# Patient Record
Sex: Female | Born: 1980
Health system: Southern US, Community
[De-identification: ages and names within clinical notes are randomized; demographics above are authoritative.]

## PROBLEM LIST (undated history)

## (undated) ENCOUNTER — Inpatient Hospital Stay (HOSPITAL_COMMUNITY): Payer: Self-pay

## (undated) DIAGNOSIS — F32A Depression, unspecified: Secondary | ICD-10-CM

## (undated) DIAGNOSIS — D649 Anemia, unspecified: Secondary | ICD-10-CM

## (undated) DIAGNOSIS — G4700 Insomnia, unspecified: Secondary | ICD-10-CM

## (undated) DIAGNOSIS — N390 Urinary tract infection, site not specified: Secondary | ICD-10-CM

## (undated) DIAGNOSIS — F329 Major depressive disorder, single episode, unspecified: Secondary | ICD-10-CM

## (undated) DIAGNOSIS — Z9884 Bariatric surgery status: Secondary | ICD-10-CM

## (undated) DIAGNOSIS — F1910 Other psychoactive substance abuse, uncomplicated: Secondary | ICD-10-CM

## (undated) DIAGNOSIS — N83209 Unspecified ovarian cyst, unspecified side: Secondary | ICD-10-CM

## (undated) HISTORY — DX: Bariatric surgery status: Z98.84

## (undated) HISTORY — DX: Other psychoactive substance abuse, uncomplicated: F19.10

## (undated) HISTORY — DX: Morbid (severe) obesity due to excess calories: E66.01

## (undated) HISTORY — DX: Anemia, unspecified: D64.9

## (undated) HISTORY — PX: BREAST REDUCTION SURGERY: SHX8

## (undated) HISTORY — PX: INDUCED ABORTION: SHX677

---

## 2002-08-09 HISTORY — PX: CHOLECYSTECTOMY: SHX55

## 2002-08-10 DIAGNOSIS — Z9884 Bariatric surgery status: Secondary | ICD-10-CM

## 2002-08-10 HISTORY — DX: Bariatric surgery status: Z98.84

## 2003-01-23 HISTORY — PX: GASTRIC BYPASS: SHX52

## 2004-03-20 ENCOUNTER — Emergency Department (HOSPITAL_COMMUNITY): Admission: EM | Admit: 2004-03-20 | Discharge: 2004-03-20 | Payer: Self-pay | Admitting: Emergency Medicine

## 2004-04-09 ENCOUNTER — Ambulatory Visit (HOSPITAL_COMMUNITY): Admission: RE | Admit: 2004-04-09 | Discharge: 2004-04-09 | Payer: Self-pay | Admitting: Obstetrics and Gynecology

## 2004-04-23 ENCOUNTER — Ambulatory Visit: Payer: Self-pay | Admitting: Family Medicine

## 2004-04-29 ENCOUNTER — Inpatient Hospital Stay (HOSPITAL_COMMUNITY): Admission: AD | Admit: 2004-04-29 | Discharge: 2004-04-29 | Payer: Self-pay | Admitting: *Deleted

## 2004-04-29 ENCOUNTER — Ambulatory Visit: Payer: Self-pay | Admitting: Family Medicine

## 2004-05-28 ENCOUNTER — Ambulatory Visit: Payer: Self-pay | Admitting: Obstetrics & Gynecology

## 2004-05-29 ENCOUNTER — Ambulatory Visit (HOSPITAL_COMMUNITY): Admission: RE | Admit: 2004-05-29 | Discharge: 2004-05-29 | Payer: Self-pay | Admitting: *Deleted

## 2004-06-11 ENCOUNTER — Ambulatory Visit: Payer: Self-pay | Admitting: Family Medicine

## 2004-06-11 ENCOUNTER — Ambulatory Visit: Payer: Self-pay | Admitting: Obstetrics & Gynecology

## 2004-06-17 ENCOUNTER — Inpatient Hospital Stay (HOSPITAL_COMMUNITY): Admission: AD | Admit: 2004-06-17 | Discharge: 2004-06-17 | Payer: Self-pay | Admitting: Family Medicine

## 2004-06-18 ENCOUNTER — Inpatient Hospital Stay (HOSPITAL_COMMUNITY): Admission: AD | Admit: 2004-06-18 | Discharge: 2004-06-18 | Payer: Self-pay | Admitting: Gynecology

## 2004-07-02 ENCOUNTER — Ambulatory Visit: Payer: Self-pay | Admitting: *Deleted

## 2004-07-09 ENCOUNTER — Ambulatory Visit: Payer: Self-pay | Admitting: *Deleted

## 2004-07-16 ENCOUNTER — Ambulatory Visit: Payer: Self-pay | Admitting: *Deleted

## 2004-07-18 ENCOUNTER — Ambulatory Visit (HOSPITAL_COMMUNITY): Admission: RE | Admit: 2004-07-18 | Discharge: 2004-07-18 | Payer: Self-pay | Admitting: Family Medicine

## 2004-07-23 ENCOUNTER — Ambulatory Visit: Payer: Self-pay | Admitting: *Deleted

## 2004-07-28 ENCOUNTER — Inpatient Hospital Stay (HOSPITAL_COMMUNITY): Admission: RE | Admit: 2004-07-28 | Discharge: 2004-07-31 | Payer: Self-pay | Admitting: Obstetrics & Gynecology

## 2004-07-28 ENCOUNTER — Encounter (INDEPENDENT_AMBULATORY_CARE_PROVIDER_SITE_OTHER): Payer: Self-pay | Admitting: Specialist

## 2004-08-04 ENCOUNTER — Inpatient Hospital Stay (HOSPITAL_COMMUNITY): Admission: AD | Admit: 2004-08-04 | Discharge: 2004-08-04 | Payer: Self-pay | Admitting: Family Medicine

## 2004-08-07 ENCOUNTER — Ambulatory Visit: Payer: Self-pay | Admitting: Family Medicine

## 2004-09-04 ENCOUNTER — Ambulatory Visit: Payer: Self-pay | Admitting: Family Medicine

## 2004-11-13 HISTORY — PX: HERNIA REPAIR: SHX51

## 2005-04-22 ENCOUNTER — Emergency Department (HOSPITAL_COMMUNITY): Admission: EM | Admit: 2005-04-22 | Discharge: 2005-04-22 | Payer: Self-pay | Admitting: Emergency Medicine

## 2005-11-14 ENCOUNTER — Emergency Department (HOSPITAL_COMMUNITY): Admission: EM | Admit: 2005-11-14 | Discharge: 2005-11-14 | Payer: Self-pay | Admitting: Emergency Medicine

## 2006-08-06 ENCOUNTER — Inpatient Hospital Stay (HOSPITAL_COMMUNITY): Admission: AD | Admit: 2006-08-06 | Discharge: 2006-08-06 | Payer: Self-pay | Admitting: Obstetrics and Gynecology

## 2006-09-19 ENCOUNTER — Emergency Department (HOSPITAL_COMMUNITY): Admission: EM | Admit: 2006-09-19 | Discharge: 2006-09-19 | Payer: Self-pay | Admitting: Emergency Medicine

## 2007-01-27 ENCOUNTER — Ambulatory Visit: Payer: Self-pay | Admitting: Oncology

## 2007-02-17 LAB — MORPHOLOGY - CHCC SATELLITE: PLT EST ~~LOC~~: ADEQUATE

## 2007-02-17 LAB — CBC WITH DIFFERENTIAL (CANCER CENTER ONLY)
BASO#: 0 10*3/uL (ref 0.0–0.2)
BASO%: 0.5 % (ref 0.0–2.0)
EOS%: 1.1 % (ref 0.0–7.0)
Eosinophils Absolute: 0.1 10*3/uL (ref 0.0–0.5)
HCT: 29.5 % — ABNORMAL LOW (ref 34.8–46.6)
HGB: 9.2 g/dL — ABNORMAL LOW (ref 11.6–15.9)
LYMPH#: 2.7 10*3/uL (ref 0.9–3.3)
LYMPH%: 40.1 % (ref 14.0–48.0)
MCH: 18.6 pg — ABNORMAL LOW (ref 26.0–34.0)
MCHC: 31 g/dL — ABNORMAL LOW (ref 32.0–36.0)
MCV: 60 fL — ABNORMAL LOW (ref 81–101)
MONO#: 0.4 10*3/uL (ref 0.1–0.9)
MONO%: 6.5 % (ref 0.0–13.0)
NEUT#: 3.5 10*3/uL (ref 1.5–6.5)
NEUT%: 51.8 % (ref 39.6–80.0)
Platelets: 323 10*3/uL (ref 145–400)
RBC: 4.91 10*6/uL (ref 3.70–5.32)
RDW: 17.7 % — ABNORMAL HIGH (ref 10.5–14.6)
WBC: 6.8 10*3/uL (ref 3.9–10.0)

## 2007-02-21 LAB — COMPREHENSIVE METABOLIC PANEL
ALT: 18 U/L (ref 0–35)
AST: 26 U/L (ref 0–37)
Albumin: 2.9 g/dL — ABNORMAL LOW (ref 3.5–5.2)
Alkaline Phosphatase: 37 U/L — ABNORMAL LOW (ref 39–117)
BUN: 5 mg/dL — ABNORMAL LOW (ref 6–23)
CO2: 21 mEq/L (ref 19–32)
Calcium: 9 mg/dL (ref 8.4–10.5)
Chloride: 104 mEq/L (ref 96–112)
Creatinine, Ser: 0.41 mg/dL (ref 0.40–1.20)
Glucose, Bld: 120 mg/dL — ABNORMAL HIGH (ref 70–99)
Potassium: 4 mEq/L (ref 3.5–5.3)
Sodium: 134 mEq/L — ABNORMAL LOW (ref 135–145)
Total Bilirubin: 0.3 mg/dL (ref 0.3–1.2)
Total Protein: 5.9 g/dL — ABNORMAL LOW (ref 6.0–8.3)

## 2007-02-21 LAB — HEMOGLOBINOPATHY EVALUATION
Hemoglobin Other: 0 % (ref 0.0–0.0)
Hgb A2 Quant: 1.3 % — ABNORMAL LOW (ref 2.2–3.2)
Hgb A: 98.7 % — ABNORMAL HIGH (ref 96.8–97.8)
Hgb F Quant: 0 % (ref 0.0–2.0)
Hgb S Quant: 0 % (ref 0.0–0.0)

## 2007-02-21 LAB — RETICULOCYTES (CHCC)
ABS Retic: 49.9 10*3/uL (ref 19.0–186.0)
RBC.: 4.99 MIL/uL (ref 3.87–5.11)
Retic Ct Pct: 1 % (ref 0.4–3.1)

## 2007-02-21 LAB — FOLATE: Folate: >20 ng/mL

## 2007-02-21 LAB — IRON AND TIBC
Iron: <10 ug/dL — ABNORMAL LOW (ref 42–145)
UIBC: 446 ug/dL

## 2007-02-21 LAB — VITAMIN B12: Vitamin B-12: 286 pg/mL (ref 211–911)

## 2007-02-21 LAB — FERRITIN: Ferritin: 3 ng/mL — ABNORMAL LOW (ref 10–291)

## 2007-03-20 ENCOUNTER — Inpatient Hospital Stay (HOSPITAL_COMMUNITY): Admission: AD | Admit: 2007-03-20 | Discharge: 2007-03-20 | Payer: Self-pay | Admitting: *Deleted

## 2007-04-05 ENCOUNTER — Ambulatory Visit: Payer: Self-pay | Admitting: Oncology

## 2007-04-06 LAB — CBC WITH DIFFERENTIAL (CANCER CENTER ONLY)
BASO#: 0 10*3/uL (ref 0.0–0.2)
BASO%: 0.3 % (ref 0.0–2.0)
EOS%: 1 % (ref 0.0–7.0)
Eosinophils Absolute: 0.1 10*3/uL (ref 0.0–0.5)
HCT: 26.6 % — ABNORMAL LOW (ref 34.8–46.6)
HGB: 8.4 g/dL — ABNORMAL LOW (ref 11.6–15.9)
LYMPH#: 1.9 10*3/uL (ref 0.9–3.3)
LYMPH%: 33.8 % (ref 14.0–48.0)
MCH: 18.9 pg — ABNORMAL LOW (ref 26.0–34.0)
MCHC: 31.6 g/dL — ABNORMAL LOW (ref 32.0–36.0)
MCV: 60 fL — ABNORMAL LOW (ref 81–101)
MONO#: 0.5 10*3/uL (ref 0.1–0.9)
MONO%: 8.1 % (ref 0.0–13.0)
NEUT#: 3.2 10*3/uL (ref 1.5–6.5)
NEUT%: 56.8 % (ref 39.6–80.0)
Platelets: 307 10*3/uL (ref 145–400)
RBC: 4.45 10*6/uL (ref 3.70–5.32)
RDW: 16.8 % — ABNORMAL HIGH (ref 10.5–14.6)
WBC: 5.7 10*3/uL (ref 3.9–10.0)

## 2007-04-06 LAB — MORPHOLOGY - CHCC SATELLITE
PLT EST ~~LOC~~: ADEQUATE
Platelet Morphology: NORMAL

## 2007-04-08 LAB — COMPREHENSIVE METABOLIC PANEL
ALT: 11 U/L (ref 0–35)
AST: 21 U/L (ref 0–37)
Albumin: 3.2 g/dL — ABNORMAL LOW (ref 3.5–5.2)
Alkaline Phosphatase: 44 U/L (ref 39–117)
BUN: 7 mg/dL (ref 6–23)
CO2: 20 mEq/L (ref 19–32)
Calcium: 8.6 mg/dL (ref 8.4–10.5)
Chloride: 105 mEq/L (ref 96–112)
Creatinine, Ser: 0.5 mg/dL (ref 0.40–1.20)
Glucose, Bld: 87 mg/dL (ref 70–99)
Potassium: 4 mEq/L (ref 3.5–5.3)
Sodium: 135 mEq/L (ref 135–145)
Total Bilirubin: 0.5 mg/dL (ref 0.3–1.2)
Total Protein: 6.4 g/dL (ref 6.0–8.3)

## 2007-04-08 LAB — HEMOGLOBINOPATHY EVALUATION
Hemoglobin Other: 0 % (ref 0.0–0.0)
Hgb A2 Quant: 1.8 % — ABNORMAL LOW (ref 2.2–3.2)
Hgb A: 98.2 % — ABNORMAL HIGH (ref 96.8–97.8)
Hgb F Quant: 0 % (ref 0.0–2.0)
Hgb S Quant: 0 % (ref 0.0–0.0)

## 2007-04-08 LAB — FERRITIN: Ferritin: 5 ng/mL — ABNORMAL LOW (ref 10–291)

## 2007-04-08 LAB — IRON AND TIBC
%SAT: 3 % — ABNORMAL LOW (ref 20–55)
Iron: 15 ug/dL — ABNORMAL LOW (ref 42–145)
TIBC: 451 ug/dL (ref 250–470)
UIBC: 436 ug/dL

## 2007-04-08 LAB — RETICULOCYTES (CHCC)
ABS Retic: 76.3 10*3/uL (ref 19.0–186.0)
RBC.: 4.49 MIL/uL (ref 3.87–5.11)
Retic Ct Pct: 1.7 % (ref 0.4–3.1)

## 2007-04-08 LAB — FOLATE: Folate: 19.2 ng/mL

## 2007-04-08 LAB — VITAMIN B12: Vitamin B-12: 207 pg/mL — ABNORMAL LOW (ref 211–911)

## 2007-05-18 LAB — CBC WITH DIFFERENTIAL (CANCER CENTER ONLY)
BASO#: 0 10*3/uL (ref 0.0–0.2)
BASO%: 0.2 % (ref 0.0–2.0)
EOS%: 1 % (ref 0.0–7.0)
Eosinophils Absolute: 0.1 10*3/uL (ref 0.0–0.5)
HCT: 27.9 % — ABNORMAL LOW (ref 34.8–46.6)
HGB: 8.8 g/dL — ABNORMAL LOW (ref 11.6–15.9)
LYMPH#: 2.1 10*3/uL (ref 0.9–3.3)
LYMPH%: 35.3 % (ref 14.0–48.0)
MCH: 20.4 pg — ABNORMAL LOW (ref 26.0–34.0)
MCHC: 31.6 g/dL — ABNORMAL LOW (ref 32.0–36.0)
MCV: 65 fL — ABNORMAL LOW (ref 81–101)
MONO#: 0.4 10*3/uL (ref 0.1–0.9)
MONO%: 7.4 % (ref 0.0–13.0)
NEUT#: 3.3 10*3/uL (ref 1.5–6.5)
NEUT%: 56.1 % (ref 39.6–80.0)
Platelets: 283 10*3/uL (ref 145–400)
RBC: 4.31 10*6/uL (ref 3.70–5.32)
RDW: 21.7 % — ABNORMAL HIGH (ref 10.5–14.6)
WBC: 5.9 10*3/uL (ref 3.9–10.0)

## 2007-05-18 LAB — IRON AND TIBC
%SAT: 28 % (ref 20–55)
Iron: 133 ug/dL (ref 42–145)
TIBC: 480 ug/dL — ABNORMAL HIGH (ref 250–470)
UIBC: 347 ug/dL

## 2007-05-18 LAB — FERRITIN: Ferritin: 7 ng/mL — ABNORMAL LOW (ref 10–291)

## 2007-06-13 ENCOUNTER — Inpatient Hospital Stay (HOSPITAL_COMMUNITY): Admission: AD | Admit: 2007-06-13 | Discharge: 2007-06-13 | Payer: Self-pay | Admitting: *Deleted

## 2007-06-14 ENCOUNTER — Inpatient Hospital Stay (HOSPITAL_COMMUNITY): Admission: AD | Admit: 2007-06-14 | Discharge: 2007-06-14 | Payer: Self-pay | Admitting: *Deleted

## 2007-06-15 ENCOUNTER — Inpatient Hospital Stay (HOSPITAL_COMMUNITY): Admission: AD | Admit: 2007-06-15 | Discharge: 2007-06-15 | Payer: Self-pay | Admitting: *Deleted

## 2007-06-27 ENCOUNTER — Ambulatory Visit: Payer: Self-pay | Admitting: Oncology

## 2007-08-22 ENCOUNTER — Inpatient Hospital Stay (HOSPITAL_COMMUNITY): Admission: RE | Admit: 2007-08-22 | Discharge: 2007-08-26 | Payer: Self-pay | Admitting: *Deleted

## 2007-08-29 ENCOUNTER — Inpatient Hospital Stay (HOSPITAL_COMMUNITY): Admission: RE | Admit: 2007-08-29 | Discharge: 2007-08-29 | Payer: Self-pay | Admitting: Obstetrics and Gynecology

## 2007-09-10 ENCOUNTER — Inpatient Hospital Stay (HOSPITAL_COMMUNITY): Admission: AD | Admit: 2007-09-10 | Discharge: 2007-09-10 | Payer: Self-pay | Admitting: Obstetrics & Gynecology

## 2008-12-25 HISTORY — PX: BREAST SURGERY: SHX581

## 2009-11-29 ENCOUNTER — Emergency Department (HOSPITAL_BASED_OUTPATIENT_CLINIC_OR_DEPARTMENT_OTHER): Admission: EM | Admit: 2009-11-29 | Discharge: 2009-11-29 | Payer: Self-pay | Admitting: Emergency Medicine

## 2009-12-11 ENCOUNTER — Emergency Department (HOSPITAL_BASED_OUTPATIENT_CLINIC_OR_DEPARTMENT_OTHER): Admission: EM | Admit: 2009-12-11 | Discharge: 2009-12-11 | Payer: Self-pay | Admitting: Emergency Medicine

## 2010-03-18 ENCOUNTER — Emergency Department (HOSPITAL_BASED_OUTPATIENT_CLINIC_OR_DEPARTMENT_OTHER): Admission: EM | Admit: 2010-03-18 | Discharge: 2010-03-18 | Payer: Self-pay | Admitting: Emergency Medicine

## 2010-10-24 LAB — COMPREHENSIVE METABOLIC PANEL WITH GFR
ALT: 223 U/L — ABNORMAL HIGH (ref 0–35)
AST: 512 U/L — ABNORMAL HIGH (ref 0–37)
Albumin: 4.6 g/dL (ref 3.5–5.2)
Alkaline Phosphatase: 72 U/L (ref 39–117)
BUN: 3 mg/dL — ABNORMAL LOW (ref 6–23)
CO2: 23 meq/L (ref 19–32)
Calcium: 8.7 mg/dL (ref 8.4–10.5)
Chloride: 107 meq/L (ref 96–112)
Creatinine, Ser: 0.5 mg/dL (ref 0.4–1.2)
GFR calc Af Amer: >60 mL/min (ref >60)
GFR calc non Af Amer: >60 mL/min (ref >60)
Glucose, Bld: 103 mg/dL — ABNORMAL HIGH (ref 70–99)
Potassium: 4.3 meq/L (ref 3.5–5.1)
Sodium: 145 meq/L (ref 135–145)
Total Bilirubin: 0.5 mg/dL (ref 0.3–1.2)
Total Protein: 8.9 g/dL — ABNORMAL HIGH (ref 6.0–8.3)

## 2010-10-24 LAB — CBC
HCT: 30.9 % — ABNORMAL LOW (ref 36.0–46.0)
Hemoglobin: 9.3 g/dL — ABNORMAL LOW (ref 12.0–15.0)
MCH: 18.9 pg — ABNORMAL LOW (ref 26.0–34.0)
MCHC: 30.3 g/dL (ref 30.0–36.0)
MCV: 62.6 fL — ABNORMAL LOW (ref 78.0–100.0)
Platelets: 396 10*3/uL (ref 150–400)
RBC: 4.93 MIL/uL (ref 3.87–5.11)
RDW: 20.7 % — ABNORMAL HIGH (ref 11.5–15.5)
WBC: 4.3 10*3/uL (ref 4.0–10.5)

## 2010-10-24 LAB — DIFFERENTIAL
Band Neutrophils: 0 % (ref 0–10)
Basophils Absolute: 0 K/uL (ref 0.0–0.1)
Basophils Relative: 0 % (ref 0–1)
Blasts: 0 %
Eosinophils Absolute: 0 K/uL (ref 0.0–0.7)
Eosinophils Relative: 0 % (ref 0–5)
Lymphocytes Relative: 44 % (ref 12–46)
Lymphs Abs: 1.9 K/uL (ref 0.7–4.0)
Metamyelocytes Relative: 0 %
Monocytes Absolute: 0.7 K/uL (ref 0.1–1.0)
Monocytes Relative: 16 % — ABNORMAL HIGH (ref 3–12)
Myelocytes: 0 %
Neutro Abs: 1.7 K/uL (ref 1.7–7.7)
Neutrophils Relative %: 40 % — ABNORMAL LOW (ref 43–77)
Promyelocytes Absolute: 0 %
nRBC: 0 /100{WBCs}

## 2010-10-24 LAB — ETHANOL: Alcohol, Ethyl (B): 331 mg/dL — ABNORMAL HIGH (ref 0–10)

## 2010-10-28 LAB — URINALYSIS, ROUTINE W REFLEX MICROSCOPIC
Bilirubin Urine: NEGATIVE
Glucose, UA: NEGATIVE mg/dL
Hgb urine dipstick: NEGATIVE
Ketones, ur: 15 mg/dL — AB
Nitrite: NEGATIVE
Protein, ur: NEGATIVE mg/dL
Specific Gravity, Urine: 1.023 (ref 1.005–1.030)
Urobilinogen, UA: 0.2 mg/dL (ref 0.0–1.0)
pH: 5.5 (ref 5.0–8.0)

## 2010-10-28 LAB — WET PREP, GENITAL
Trich, Wet Prep: NONE SEEN
Yeast Wet Prep HPF POC: NONE SEEN

## 2010-10-28 LAB — GC/CHLAMYDIA PROBE AMP, GENITAL
Chlamydia, DNA Probe: NEGATIVE
GC Probe Amp, Genital: NEGATIVE

## 2010-10-28 LAB — PREGNANCY, URINE: Preg Test, Ur: NEGATIVE

## 2010-12-23 NOTE — Op Note (Signed)
Jennifer Brown, Jennifer Brown               ACCOUNT NO.:  [id number]   MEDICAL RECORD NO.:  [id number]          PATIENT TYPE:  INP   LOCATION:  9146                          [facility]:  [facility]   PHYSICIAN:  [place] B. Earlene Plater, M.D.  DATE OF BIRTH:  Jun 21, 1981   DATE OF PROCEDURE:  08/22/2007  DATE OF DISCHARGE:                               OPERATIVE REPORT   PREOPERATIVE DIAGNOSES:  1. 39-week intrauterine pregnancy.  2. Previous C-section x 2.   POSTOPERATIVE DIAGNOSES:  1. 39-week intrauterine pregnancy.  2. Previous C-section x 2.   PROCEDURE:  Repeat low transverse C-section.   SURGEON:  Marina Gravel, M.D.   ASSISTANT:  Lendon Colonel, MD   ANESTHESIA:  Spinal.   FINDINGS:  Viable female, 8/9 Apgars, 6 pounds 1 ounce.  Normal-  appearing uterus, tubes and ovaries.   SPECIMENS:  None.   BLOOD LOSS:  500 mL.   COMPLICATIONS:  None.   URINE OUTPUT:  125 mL.   IV FLUIDS:  2500 mL.   INDICATIONS:  Patient with above history for repeat C-section.  Advised  the risks of surgery including infection, bleeding, damage to  surrounding organs.   PROCEDURE:  The patient taken to the operating room.  Spinal anesthesia  obtained.  Prepped draped in standard fashion.  Foley catheter inserted  to the bladder.   Pfannenstiel incision made, fascia divided sharply.  Underlying rectus  muscles dissected off sharply.  Peritoneum was entered sharply, extended  superiorly and inferiorly with good visualization of surrounding organs.  Bladder flap created sharply.  Bladder blade inserted.  Uterine incision  made in low transverse fashion.  Incision extended bluntly and with  bandage scissors.  Clear fluid at amniotomy.  Vertex was elevated to the  incision and with fundal pressure, delivered without difficulty.  Nose  and mouth suctioned with bulb.  Remainder of infant delivered without  difficulty.  Cord clamped and cut, infant handed off to waiting  pediatricians.  1 gram Ancef given cord  clamp.   Placenta was removed by uterine massage.  Uterus exteriorized and  cleared of all clots and debris.  The incision was free of extension.  Closed in two layers with 0-0 chromic with hemostasis obtained.  Tubes  and ovaries were inspected and appeared normal.   Uterus returned to the abdomen, pelvis irrigated.  Uterine incision,  bladder flap, subfascial space were all hemostatic.  The fascia was  closed with running stitch of 0-0 Vicryl.  Subcutaneous tissue was  irrigated, made hemostatic with Bovie.  Skin was closed with staples.   The patient tolerated the procedure well, no complications.  She is  taken to recovery room, awake, alert,  in stable condition.  All counts  correct per the operating staff.      Gerri Spore B. Earlene Plater, M.D.  Electronically Signed     WBD/MEDQ  D:  08/22/2007  T:  08/22/2007  Job:  [id number]

## 2010-12-26 NOTE — Op Note (Signed)
NAMEISOLA, MEHLMAN               ACCOUNT NO.:  [id number]   MEDICAL RECORD NO.:  [id number]          PATIENT TYPE:  INP   LOCATION:  9110                          [facility]   PHYSICIAN:  Lesly Dukes, M.D. DATE OF BIRTH:  Nov 01, 1980   DATE OF PROCEDURE:  07/29/2004  DATE OF DISCHARGE:                                 OPERATIVE REPORT   PREOPERATIVE DIAGNOSIS:  A gravida 3, para 1-0-1-1, at 39+ weeks' estimated  gestational age, for repeat low flap transverse cesarean section.   POSTOPERATIVE DIAGNOSIS:  A gravida 3, para 1-0-1-1, at 39+ weeks' estimated  gestational age, for repeat low flap transverse cesarean section.   PROCEDURE:  Repeat low flap transverse cesarean section.   SURGEON:  Lesly Dukes, M.D.   ASSISTANT:  Shelbie Proctor. Shawnie Pons, M.D.   ANESTHESIA:  Spinal.   ESTIMATED BLOOD LOSS:  800 mL.   COMPLICATIONS:  None.   PATHOLOGY:  Placenta.   FINDINGS:  A viable female infant.  Apgars 8 at one minute and 9 at five  minutes.  Vertex presentation.  Copious amounts of clear fluid.  Normal  placenta grossly with three-vessel cord.  Positive FH prior to skin  incision, around 140-150.  Arterial cord pH of 7.09.  Grossly normal ovaries  and fallopian tubes bilaterally.  Minimal scar tissue.  NICU at delivery.   PROCEDURE:  After informed consent was obtained, the patient was taken to  the operating room, where spinal anesthesia was found to be adequate.  The  patient was placed in the dorsal supine position with a leftward tilt and  prepared and draped in normal sterile fashion.  A Foley was in the bladder.  A Pfannenstiel skin incision was made with a scalpel and carried down to the  underlying layer of fascia.  The fascia was incised in the midline and this  incision was extended bilaterally.  Superior and inferior aspects of the  fascial incision were grasped with Kocher clamps, tented up, and dissected  off sharply and bluntly from the __________ rectus  muscles.  The rectus  muscles were separated in the midline easily.  The peritoneum was  identified, tented up, and entered sharply with Metzenbaum scissors.  This  incision was extended both superiorly and inferiorly with good visualization  of the bladder.  The bladder blade was inserted.  The vesicouterine  peritoneum identified, tented up, and entered sharply with Metzenbaum  scissors.  This incision was extended bilaterally, and the bladder flap was  created digitally.  The bladder blade was reinserted.  The uterus was  incised in the lower uterine segment with a scalpel and this incision was  extended bilaterally with the bandage scissors.  The amniotic sac was  ruptured and the baby delivered vertex.  Nose and mouth were suctioned.  The  rest of the baby's body delivered easily.  The cord was clamped and cut and  the baby was handed off to the waiting pediatrician.  The cord blood was  sent for type and screen and a cord gas was also sent.  The placenta was  delivered  spontaneously with three-vessel cord.  The uterus was cleared of  all clots and debris.  The uterine incision was closed with 0 Vicryl in a  running locked fashion.  A second suture of 0 Vicryl was used to reinforce  the incision as the patient plans to be pregnant again.  Good hemostasis was  noted.  The intra-abdominal cavity was irrigated and once again the uterine  incision was found to be hemostatic.  The peritoneum, rectus muscle, and  fascia were also hemostasis.  The fascia was closed with 0 Vicryl in a  running fashion.  The subcutaneous tissue was copiously irrigated and found  to be hemostatic.  The skin was closed with staples and a bandage was placed  on the abdomen.  The patient tolerated the procedure well.  The sponge, lap,  instrument, and needle count were correct x2, and the patient went to the  recovery room in stable condition.      KHL/MEDQ  D:  07/28/2004  T:  07/29/2004  Job:  [id number]

## 2010-12-26 NOTE — Discharge Summary (Signed)
Jennifer Brown, Jennifer Brown               ACCOUNT NO.:  [id number]   MEDICAL RECORD NO.:  [id number]          PATIENT TYPE:  INP   LOCATION:  9110                          [facility]   PHYSICIAN:  Lesly Dukes, M.D. DATE OF BIRTH:  1981/07/05   DATE OF ADMISSION:  07/28/2004  DATE OF DISCHARGE:  07/31/2004                                 DISCHARGE SUMMARY   HISTORY OF PRESENT ILLNESS:  This is a 30 year old gravida 3, para 1-0-1-1,  who was admitted at term for an elective repeat cesarean section.  The  patient did have previous cesarean section for a 10 pound baby and elected  to undergo a cesarean section for this pregnancy.  The patient did undergo  gastric bypass surgery apparently a year and a half ago with a weight loss  of approximately 150 pounds.   HOSPITAL COURSE:  The patient did undergo a repeat low transverse cesarean  section on July 28, 2004, and was delivered of a female with Apgars of 8  and 9.  Birth weight was 6 pounds 14 ounces.  Estimated blood loss was 850  mL.  Surgeon was Lesly Dukes, M.D.  The patient had an uneventful  hospital course and on postpartum day #3 and postoperative day #3, she was  eating, drinking, and ambulating without difficulty and is afebrile.   ADMISSION DIAGNOSES:  1.  Term pregnancy.  2.  Previous cesarean section.  3.  For repeat low transverse cesarean section.   DISCHARGE DIAGNOSES:  1.  Term pregnancy, delivered.  2.  Low transverse cesarean section.   DISCHARGE INSTRUCTIONS:  The patient was to come back in two days to have  her incision checked in the maternity admission unit at the hospital.  No  driving or heavy lifting for two weeks.  Nothing in the vagina for six  weeks.  She is to ensure that she has adequate protein of at least 80 grams  a day.  She is to have a follow-up appointment with the bariatric physician  within two months.  She is to keep her regular six-week appointment at  [facility].  She is to  take prenatal vitamins for at least six weeks or  while nursing and pain medications as ordered.   Note, this patient is not to have any ibuprofen or any NSAIDS due to her  bariatric surgery as contraindicated.   DISCHARGE MEDICATIONS:  1.  Percocet one to two tablets q.4-6h p.r.n. pain.  2.  Prenatal vitamins one p.o. daily.     [place]/MEDQ  D:  10/09/2004  T:  10/09/2004  Job:  [id number]

## 2010-12-26 NOTE — Discharge Summary (Signed)
[name], Jennifer Brown                 ACCOUNT NO.:  [id number]   MEDICAL RECORD NO.:  [id number]          PATIENT TYPE:  INP   LOCATION:  9146                          [facility]   PHYSICIAN:  H[place] B. Earlene Plater, M.D.  DATE OF BIRTH:  03-Feb-1981   DATE OF ADMISSION:  08/22/2007  DATE OF DISCHARGE:  08/26/2007                               DISCHARGE SUMMARY   ADMISSION DIAGNOSES:  1. A 39-week intrauterine pregnancy.  2. Previous cesarean section x2.   DISCHARGE DIAGNOSES:  1. A 39-week intrauterine pregnancy.  2. Previous cesarean section x2.   PROCEDURE:  Repeat low transverse C-section.   HISTORY OF PRESENT ILLNESS:  A 30 year old African female with above  history, presents for repeat C-section.   HOSPITAL COURSE:  The patient was admitted, underwent repeat low  transverse C-section, viable female Apgars 8 and 9, 6 pounds 1 ounce,  normal-appearing uterus, tubes and ovaries.   Postoperatively, the patient rapidly regained her ability to ambulate,  void and tolerate a regular diet.  She did have some mild issues with  pain control which were addressed and ultimately discharged home on the  fourth postoperative day in satisfactory condition.   DISCHARGE INSTRUCTIONS:  Per booklet.   FOLLOW-UP:  [facility], Dr. Earlene Plater in six weeks.   DISCHARGE MEDICATION:  Tylox one to two p.o. q.4-6h. p.r.n. pain.   CONDITION ON DISCHARGE:  Satisfactory.      Gerri Spore B. Earlene Plater, M.D.  Electronically Signed     WBD/MEDQ  D:  09/19/2007  T:  09/20/2007  Job:  [id number]

## 2011-04-29 LAB — DIFFERENTIAL
Basophils Relative: 0
Lymphocytes Relative: 20
Monocytes Relative: 9
Neutro Abs: 4.6

## 2011-04-29 LAB — CBC
Hemoglobin: 10.1 — ABNORMAL LOW
Hemoglobin: 8.1 — ABNORMAL LOW
MCHC: 31.6
MCV: 69.4 — ABNORMAL LOW
RBC: 3.7 — ABNORMAL LOW
RBC: 4.58

## 2011-04-30 LAB — CBC
Hemoglobin: 9.3 — ABNORMAL LOW
MCHC: 32.4
RBC: 4.24
WBC: 5.4

## 2011-04-30 LAB — URINALYSIS, ROUTINE W REFLEX MICROSCOPIC
Glucose, UA: NEGATIVE
Ketones, ur: NEGATIVE
Protein, ur: NEGATIVE

## 2011-04-30 LAB — COMPREHENSIVE METABOLIC PANEL
ALT: 13
Alkaline Phosphatase: 105
CO2: 24
Calcium: 8.1 — ABNORMAL LOW
Chloride: 109
GFR calc non Af Amer: >60
Glucose, Bld: 84
Sodium: 140
Total Bilirubin: 0.7

## 2011-04-30 LAB — URINE MICROSCOPIC-ADD ON

## 2011-04-30 LAB — LACTATE DEHYDROGENASE: LDH: 166

## 2011-05-25 LAB — URINALYSIS, ROUTINE W REFLEX MICROSCOPIC
Bilirubin Urine: NEGATIVE
Hgb urine dipstick: NEGATIVE
Ketones, ur: NEGATIVE
Protein, ur: NEGATIVE
Urobilinogen, UA: 0.2

## 2011-10-29 ENCOUNTER — Telehealth: Payer: Self-pay | Admitting: Hematology & Oncology

## 2011-10-29 NOTE — Telephone Encounter (Signed)
Patient is aware of 3-25 appointment

## 2011-10-30 ENCOUNTER — Telehealth: Payer: Self-pay | Admitting: Hematology & Oncology

## 2011-10-30 NOTE — Telephone Encounter (Signed)
Pt called and 3:25pm wanting to cx Monday 11/02/11 apt, stating she needs AM apt

## 2011-11-02 ENCOUNTER — Ambulatory Visit: Payer: Self-pay

## 2011-11-02 ENCOUNTER — Telehealth: Payer: Self-pay | Admitting: Hematology & Oncology

## 2011-11-02 ENCOUNTER — Other Ambulatory Visit: Payer: Self-pay | Admitting: Lab

## 2011-11-02 ENCOUNTER — Ambulatory Visit: Payer: Self-pay | Admitting: Hematology & Oncology

## 2011-11-02 NOTE — Telephone Encounter (Signed)
Friday pt called canceled 3-25 new patient appointment said she couldn't get a ride and she has to be home for her kids. Referring MD is aware, I talked with him on Friday. She will call back to reschedule.

## 2011-11-11 ENCOUNTER — Encounter: Payer: Self-pay | Admitting: *Deleted

## 2011-11-11 ENCOUNTER — Encounter: Payer: Medicaid Other | Attending: Obstetrics & Gynecology | Admitting: *Deleted

## 2011-11-11 DIAGNOSIS — Z713 Dietary counseling and surveillance: Secondary | ICD-10-CM | POA: Insufficient documentation

## 2011-11-11 DIAGNOSIS — Z9884 Bariatric surgery status: Secondary | ICD-10-CM | POA: Insufficient documentation

## 2011-11-11 DIAGNOSIS — O2690 Pregnancy related conditions, unspecified, unspecified trimester: Secondary | ICD-10-CM | POA: Insufficient documentation

## 2011-11-11 NOTE — Progress Notes (Signed)
Medical Nutrition Therapy:  Appt start time: 1000 end time:  1100.  Assessment:  Primary concerns today: Pregnancy with h/o Gastric Bypass surgery.  Pt is 8 years s/p gastric bypass surgery and currently [redacted] weeks pregnant. This is the 3rd pregnancy since surgery and she reports no previous problems. Pt states she is here d/t low iron levels.  Recent labs from  10/22/11 show:  Hgb: 7.4 g HCT: 27.1% Ferritin: 4 ng/mL   Iron: 13 ug Vit B12: 287 pg Folate: 13.4  Reports she has not had a follow up with bariatric surgeon since moving here a few years ago. Surgery performed in [place]. 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 Discussed supplementation, high iron food choices, and f/u with surgeon at [facility].   MEDICATIONS: See medication list provided by pt.   DIETARY INTAKE: Pt states she eats "whenever I'm hungry" and proceeded to eat half a snickers bar during visit.  Reports she can eat anything without problems except for [employer] donuts, which cause dumping syndrome.   Usual physical activity: None except walking kids to park  Progress Towards Goal(s):  In progress.   Nutritional Diagnosis:  NB-1.1 Food and nutrition-related knowledge deficit related to post-op nutritional recommendations for gastric bypass surgery as evidenced by patient reported non-compliance with supplementation and excessive consumption of high CHO foods 9 years post-op.    Intervention:  Nutrition education/reinforcement.  Goals:   Resume daily multivitamins, calcium citrate, and sublingual B-12 (see vitamin handout).  Don't take multivitamins that have iron with your calcium.  Take 2 multivitamins, 3 calcium (in 3 separate doses of 500 mg), and B-12 daily.  Add high protein shake one time a day to increase protein (Atkins, etc).  Consume whole grains, lean proteins, low fat diary, and fruits/non-starchy vegetables  Take iron with Vitamin C to increase absorption.  Increase fluids to at least 64 oz  daily.  Handouts given during visit include:  Low CHO Snack List  [facility] price list  Bariatric Support Group dates  Samples Dispensed:  Bariatric Advantage  Calcium Crystals: 3 pks Lot # [id number]; Exp: 03/14  Multivitamin  Berry (4) - Lot # [id number]; Exp: 09/13  Orange (2) - Lot # [id number]: Exp: 12/13  Jacques Earthly (2) - Lot # [id number]; Exp: 09/13  B-12 Dot (Peppermint): 8 ea Lot # [id number]; Exp: 05/13  Celebrate  Calcium Citrate  Cherry Tart (3): Lot # [id number];  Exp: 06/13  Orange Burst (3):  Lot # [id number]; Exp: 10/14  Unjury  Protein Powder (1 ea) - Chocolate Splendor: Lot # [id number], Exp: 05/14 - Strawberry: Lot # [id number]; Exp: 05/14  TwinLab  B-12 Dots: 2 pkts Lot # [id number];  Exp: 10/14  Monitoring/Evaluation:  Dietary intake, exercise, and body weight in 4 week(s).

## 2011-11-11 NOTE — Patient Instructions (Addendum)
Goals:   Resume daily multivitamins, calcium citrate, and sublingual B-12 (see vitamin handout).  Don't take multivitamins that have iron with your calcium.  Take 2 multivitamins, 3 calcium (in 3 separate doses of 500 mg), and B-12 daily.  Add high protein shake one time a day to increase protein intake (Atkins, etc).  Consume whole grains, lean proteins, low fat diary, and fruits/non-starchy vegetables  Take iron with Vitamin C to increase absorption.  Increase fluids to at least 64 oz daily.  Increase exercise to >30 min daily.

## 2011-11-12 ENCOUNTER — Encounter: Payer: Self-pay | Admitting: *Deleted

## 2011-11-16 ENCOUNTER — Telehealth: Payer: Self-pay | Admitting: Hematology & Oncology

## 2011-11-16 NOTE — Telephone Encounter (Signed)
Clydie Braun from referring called rescheduled missed 3-25 appointment to 4-17, she will let pt know.

## 2011-11-25 ENCOUNTER — Ambulatory Visit: Payer: Medicaid Other

## 2011-11-25 ENCOUNTER — Other Ambulatory Visit (HOSPITAL_BASED_OUTPATIENT_CLINIC_OR_DEPARTMENT_OTHER): Payer: Medicaid Other | Admitting: Lab

## 2011-11-25 ENCOUNTER — Ambulatory Visit (HOSPITAL_BASED_OUTPATIENT_CLINIC_OR_DEPARTMENT_OTHER): Payer: Medicaid Other | Admitting: Hematology & Oncology

## 2011-11-25 VITALS — BP 107/74 | HR 112 | Temp 97.8°F | Ht 61.0 in | Wt 181.0 lb

## 2011-11-25 DIAGNOSIS — D509 Iron deficiency anemia, unspecified: Secondary | ICD-10-CM

## 2011-11-25 LAB — CBC WITH DIFFERENTIAL (CANCER CENTER ONLY)
BASO%: 0.4 % (ref 0.0–2.0)
HCT: 23.8 % — ABNORMAL LOW (ref 34.8–46.6)
LYMPH%: 20.5 % (ref 14.0–48.0)
MCH: 17.3 pg — ABNORMAL LOW (ref 26.0–34.0)
MCV: 59 fL — ABNORMAL LOW (ref 81–101)
MONO%: 9.7 % (ref 0.0–13.0)
NEUT%: 68.9 % (ref 39.6–80.0)
Platelets: 350 10*3/uL (ref 145–400)
RDW: 22.5 % — ABNORMAL HIGH (ref 11.1–15.7)

## 2011-11-25 LAB — IRON AND TIBC

## 2011-11-25 LAB — RETICULOCYTES (CHCC)
ABS Retic: 112.6 10*3/uL (ref 19.0–186.0)
Retic Ct Pct: 2.7 % — ABNORMAL HIGH (ref 0.4–2.3)

## 2011-11-25 LAB — FERRITIN: Ferritin: 7 ng/mL — ABNORMAL LOW (ref 10–291)

## 2011-11-25 NOTE — Progress Notes (Signed)
This office note has been dictated.

## 2011-11-26 ENCOUNTER — Other Ambulatory Visit: Payer: Self-pay

## 2011-11-26 ENCOUNTER — Ambulatory Visit (HOSPITAL_COMMUNITY): Payer: Self-pay | Admitting: Licensed Clinical Social Worker

## 2011-11-26 NOTE — Progress Notes (Signed)
CC:   Arlyce Harman, MD  DIAGNOSES: 1. Marked iron-deficiency anemia. 2. First trimester pregnancy. 3. History gastric bypass.  HISTORY OF PRESENT ILLNESS:  Ms. Lamartina is a very nice 31 year old African female.  She is originally from [place].  She underwent a gastric bypass up in [place] back in 2004.  At that in point time, she states she weighed 367 pounds.  She also has had gallbladder surgery.  She has had breast reduction surgery.  This is her 4th pregnancy.  She has had iron deficiency in the past with her other pregnancies.  She actually was seen by Dr. Welton Flakes back in 2008.  At that point in time, she was iron deficient.  She was on oral iron.  She now is being referred to the [facility] by Dr. Arlyce Harman at [facility].  She is anemic again.  Ms. Mandeville does have menometrorrhagia.  She does have dysmenorrhea.  She says her cycles have been very heavy prior to this pregnancy.  Back on 10/21/2012, a CBC was done which showed a white cell count of 6.7, hemoglobin 7.4, hematocrit 27.1 and platelet count was 348.  Iron studies were done, which showed a ferritin of 4.  Iron saturation was only 3.  She had a normal B12 and folate.  She had normal BUN and creatinine.  She had a normal TSH.  She went through a myriad of pregnancy tests for I guess fetal abnormalities.  I guess everything came back okay.  She does chew ice a lot.  She has no specific cravings.  She said that she can take oral iron.  She has had no problems with this in the past.  She also has had IV iron in the past.  She has not had any bleeding other than with her menstrual cycles.  She has had no cough.  There has been no unusual weight loss.  She did lose a lot of weight after her gastric bypass.  She has had no headache.  There has been no dysphagia or odynophagia. She has had no change in bowel or bladder habits.  There has been no leg swelling.  She has not  noticed any unusual rashes.  There has been no joint swelling.  Again, Dr. Neva Seat kindly referred Ms. Berti to the [facility] for an evaluation.  She has seen a nutritionist.  PAST MEDICAL HISTORY:  Remarkable for: 1. Morbid obesity, status post gastric bypass. 2. Breast reduction surgery. 3. Cholecystectomy. 4. Hernia repair. 5. GERD.  ALLERGIES:  None.  MEDICATIONS:  Vitamin D 1000 units daily, Flexeril 10 mg p.o. daily p.r.n., folic acid 0.4 mg daily, Vicodin as needed, prenatal vitamins 1 p.o. daily, and Bactrim 1 p.o. daily, vitamin B12 one p.o. daily.  SOCIAL HISTORY:  Remarkable for tobacco use.  She does smoke maybe 1-2 cigarettes a day.  There is no alcohol use.  She has no obvious occupational exposures.  She used to work for [employer] of [place] before she got laid off.  Her husband works at [employer] over at [employer].  FAMILY HISTORY:  Negative for any hemoglobinopathy.  There is no history of sickle cell in the family.  REVIEW OF SYSTEMS:  As stated in the history of present illness.  No additional findings are noted on a 12 system review.  PHYSICAL EXAMINATION:  This is a well-developed, well-nourished black female who really does not even look her pregnant.  Vital signs: Temperature of 97.8, pulse  112, respiratory rate 18, blood pressure 107/74.  Weight is 181.  Head and neck exam shows a normocephalic, atraumatic skull.  There are no ocular or oral lesions.  There is some slight glossitis.  She does have enlarged tonsils.  She has no adenopathy in her neck.  Thyroid is nonpalpable.  Lungs:  Clear bilaterally.  Cardiac:  Tachycardic but regular.  There are no murmurs, rubs or bruits.  Abdomen:  Soft.  She is showing a little bit with the pregnancy.  There is no fluid wave.  There is no guarding or rebound tenderness.  There is no palpable hepatosplenomegaly.  Back:  No tenderness over the spine, ribs, or hips.  Extremities show no  clubbing, cyanosis or edema.  She has good range motion of her joints.  Skin:  No rashes, ecchymoses or petechia.  Neurological exam shows no focal neurological deficits.  LABORATORY STUDIES:  White cell count is 9.9, hemoglobin 7, hematocrit 24, platelet count is 350.  MCV is 59.  Peripheral smear shows a moderate anisocytosis and poikilocytosis. There are no nucleated red blood cells.  She has a couple of schistocytes.  She has a couple of target cells.  She has marked microcytic red cells.  She has no rouleaux formation.  I see no nucleated red cells.  White cells appear normal in morphology and maturation.  There may be a couple of hypersegmented polys.  I see no atypical lymphocytes.  There are no immature myeloid cells.  Her platelets are somewhat increased in number.  She has several large and gigantic platelets.  Platelets are well-granulated.  IMPRESSION:  Ms. Cirrincione is a very nice 31 year old African American female.  She clearly is from [place].  She has a great [place] accent. She is very charming.  I really had a good time with her.  She definitely is iron-deficient.  I cannot imagine anything else going on that would cause such a low MCV with this abnormal blood smear.  Since oral iron does seem to work for her, will get her back on oral iron.  I am also going to make sure that she takes extra vitamin C to help absorb iron.  I think we can try to avoid IV iron on her.  I want to see her back in 1 month.  If we find that she is not improving with her hemoglobin in 1 month, then we are going to have to consider IV iron.  Unfortunately, this can only be done at [facility] with her baby on a fetal monitor.  I do not see any other issues that she has with respect to iron deficiency.  I do not see any other bleeding source.  I do not see that she needs to have any additional studies done.  She does have a gastric bypass, so this is definitely an issue.  I  think vitamin B12 is going to be key for her.  She is now to the point where her B12 stores are becoming depleted, so extra vitamin B12 will definitely help her out.  She does not need to have any kind of bone marrow test done.  She does not need any x-ray tests done.  I want to see her back in 1 month's time.  Hopefully by then, we will see her MCV above 70 and her hemoglobin hopefully above 8-8.5.  I spent a good hour or so with Ms. Valentina Lucks.  Again, we had a great time talking.  I did  give her a prayer blanket, which she very much appreciated.    ______________________________ Josph Macho, M.D. PRE/MEDQ  D:  11/25/2011  T:  11/26/2011  Job:  [id number]  ADDENDUM:  Ferritin is 7.  Iron is <10.

## 2011-12-14 ENCOUNTER — Encounter: Payer: Self-pay | Admitting: *Deleted

## 2011-12-14 ENCOUNTER — Encounter: Payer: Medicaid Other | Attending: Obstetrics & Gynecology | Admitting: *Deleted

## 2011-12-14 DIAGNOSIS — Z713 Dietary counseling and surveillance: Secondary | ICD-10-CM | POA: Insufficient documentation

## 2011-12-14 DIAGNOSIS — O2690 Pregnancy related conditions, unspecified, unspecified trimester: Secondary | ICD-10-CM | POA: Insufficient documentation

## 2011-12-14 DIAGNOSIS — Z9884 Bariatric surgery status: Secondary | ICD-10-CM | POA: Insufficient documentation

## 2011-12-14 NOTE — Patient Instructions (Addendum)
Goals:   Add high protein shake one time a day to increase protein (Atkins, etc).  Consume whole grains, lean proteins, low fat diary, and fruits/non-starchy vegetables  Take iron with vitamin C to increase absorption.  Increase fluids to at least 64 oz daily.

## 2011-12-14 NOTE — Progress Notes (Signed)
Medical Nutrition Therapy:  Appt start time: 1000 end time:  1100.  Primary concerns today: Pregnancy with h/o Gastric Bypass surgery.  Jennifer Brown returns for f/u today and reports doing well. Has started iron and reports having a lot more energy. States she has seen Dr. Arlan Organ in [facility] regarding her anemia. Reports her platelets were a 59 and iron is low. Is taking iron supplement prescribed by him. Reports she has resumed all supplements as directed. States pregnancy is going well. Gained ~ 1lb since last vist.   MEDICATIONS: See medication list provided by pt.   DIETARY INTAKE: Same with no changes.   Usual physical activity: None except walking kids to park  Progress Towards Goal(s):  In progress.   Nutritional Diagnosis:  NB-1.1 Food and nutrition-related knowledge deficit related to post-op nutritional recommendations for gastric bypass surgery as evidenced by patient reported non-compliance with supplementation and excessive consumption of high CHO foods 9 years post-op.    Intervention:  Nutrition education/reinforcement.  Goals:  Add high protein shake one time a day to increase protein (Atkins, etc). Consume whole grains, lean proteins, low fat diary, and fruits/non-starchy vegetables Take iron with vitamin C to increase absorption. Increase fluids to at least 64 oz daily.  Samples given during visit include:  Celebrate Vit D3 5000: 5 ea (Lot # [id number]; Exp: 01/15)  Celebrate MVI (Pineapple-Strawberry): 4 ea (Lot # [id number]; Exp: 09/14)  TwinLab Vit D + K Dots: 3 pkts (Lot # [id number]; Exp: 11/14)  Monitoring/Evaluation:  Dietary intake, exercise, and body weight in 6 week(s).

## 2011-12-30 ENCOUNTER — Ambulatory Visit (HOSPITAL_BASED_OUTPATIENT_CLINIC_OR_DEPARTMENT_OTHER): Payer: Medicaid Other | Admitting: Hematology & Oncology

## 2011-12-30 ENCOUNTER — Other Ambulatory Visit (HOSPITAL_BASED_OUTPATIENT_CLINIC_OR_DEPARTMENT_OTHER): Payer: Medicaid Other | Admitting: Lab

## 2011-12-30 VITALS — BP 107/66 | HR 89 | Temp 97.6°F | Ht 61.0 in | Wt 187.0 lb

## 2011-12-30 DIAGNOSIS — D509 Iron deficiency anemia, unspecified: Secondary | ICD-10-CM

## 2011-12-30 LAB — CBC WITH DIFFERENTIAL (CANCER CENTER ONLY)
BASO%: 0.4 % (ref 0.0–2.0)
Eosinophils Absolute: 0 10*3/uL (ref 0.0–0.5)
HCT: 25.5 % — ABNORMAL LOW (ref 34.8–46.6)
LYMPH#: 1.9 10*3/uL (ref 0.9–3.3)
LYMPH%: 22.5 % (ref 14.0–48.0)
MCV: 62 fL — ABNORMAL LOW (ref 81–101)
MONO#: 0.7 10*3/uL (ref 0.1–0.9)
Platelets: 330 10*3/uL (ref 145–400)
RBC: 4.12 10*6/uL (ref 3.70–5.32)
RDW: 26.1 % — ABNORMAL HIGH (ref 11.1–15.7)
WBC: 8.4 10*3/uL (ref 3.9–10.0)

## 2011-12-30 LAB — RETICULOCYTES (CHCC): ABS Retic: 125.4 10*3/uL (ref 19.0–186.0)

## 2011-12-30 LAB — IRON AND TIBC: UIBC: 493 ug/dL — ABNORMAL HIGH (ref 125–400)

## 2011-12-30 LAB — FERRITIN: Ferritin: 5 ng/mL — ABNORMAL LOW (ref 10–291)

## 2011-12-30 NOTE — Progress Notes (Signed)
This office note has been dictated.

## 2011-12-31 NOTE — Progress Notes (Signed)
CC:   Arlyce Harman, MD  DIAGNOSES: 1. Iron deficiency anemia. 2. Second trimester pregnancy. 3. History of gastric bypass.  CURRENT THERAPY:  Oral iron, 2 p.o. daily.  INTERIM HISTORY:  Ms. Tatro comes in for followup.  She said she feels a whole lot better.  She is on oral iron.  We are trying to avoid IV iron, given that she is pregnant.  When we last saw her, her ferritin was only 7.  The patient said that she is tolerating the oral iron well.  She feels a whole lot better. She feels more energetic.  She is going to see Dr. Neva Seat, her obstetrician, this Friday.  The patient says she is having a lot of lower back pain.  She apparently had this "small pelvis" and is going to need to have another C-section.  She has had no fever, sweats or chills.  She has had no nausea or vomiting.  Of note, I did do a hemoglobin electrophoresis on her.  This showed a normal hemoglobin pattern.  PHYSICAL EXAMINATION:  This is a pregnant black female in no obvious distress.  Vital signs:  97.6, pulse 89, respiratory rate 18, blood pressure 107/66.  Weight is 187.  Head and neck:  Normocephalic, atraumatic skull.  There are no ocular or oral lesions.  There is no palpable cervical or supraclavicular lymph nodes.  Lungs:  Clear bilaterally.  Cardiac:  Regular rate and rhythm with a normal S1 and S2. There are no murmurs, rubs or bruits.  Abdomen:  Soft with good bowel sounds.  She is pregnant.  She really is not showing that much.  There is some firmness about the periumbilical region  because of the baby. There is no palpable hepatomegaly.  Extremities:  Some trace edema in her legs.  Neurologic:  No focal neurological deficits.  Skin:  No rashes, ecchymoses or petechia.  LABORATORY STUDIES:  White cell count is 8.4, hemoglobin 7.4, hematocrit 25.5, platelet count 330.  MCV is 62.  Blood smear does show anisocytosis and poikilocytosis.  She does have microcytic red cells.  She has  hypochromic red cells.  There are no target cells.  There is no rouleaux formation.  White cells appear without hypersegmented polys.  She has no immature myeloid cells.  She has no atypical lymphocytes.  Platelets are adequate in number and size.  IMPRESSION:  Ms. Engelstad is a 31 year old black female who is pregnant. She is in her 2nd trimester, going to be her 3rd trimester soon.  She is iron deficient.  She is on oral iron.  This is working for her.  I went ahead and gave her some more samples of iron.  We will plan to get her back in another month.  Hopefully, we will find that she is moving her hemoglobin up to close to 9.  I want to try to avoid IV iron if we can.    ______________________________ Josph Macho, M.D. PRE/MEDQ  D:  12/30/2011  T:  12/31/2011  Job:  [id number]

## 2012-01-29 ENCOUNTER — Other Ambulatory Visit (HOSPITAL_BASED_OUTPATIENT_CLINIC_OR_DEPARTMENT_OTHER): Payer: Medicaid Other | Admitting: Lab

## 2012-01-29 ENCOUNTER — Ambulatory Visit (HOSPITAL_BASED_OUTPATIENT_CLINIC_OR_DEPARTMENT_OTHER): Payer: Medicaid Other | Admitting: Hematology & Oncology

## 2012-01-29 VITALS — BP 108/68 | HR 106 | Temp 97.2°F | Ht 61.0 in | Wt 195.0 lb

## 2012-01-29 DIAGNOSIS — D509 Iron deficiency anemia, unspecified: Secondary | ICD-10-CM

## 2012-01-29 DIAGNOSIS — R5381 Other malaise: Secondary | ICD-10-CM

## 2012-01-29 LAB — CBC WITH DIFFERENTIAL (CANCER CENTER ONLY)
BASO%: 0.5 % (ref 0.0–2.0)
Eosinophils Absolute: 0.1 10*3/uL (ref 0.0–0.5)
HCT: 26.9 % — ABNORMAL LOW (ref 34.8–46.6)
LYMPH#: 2.4 10*3/uL (ref 0.9–3.3)
MONO#: 0.7 10*3/uL (ref 0.1–0.9)
NEUT%: 62.1 % (ref 39.6–80.0)
RBC: 4.22 10*6/uL (ref 3.70–5.32)
RDW: 24.4 % — ABNORMAL HIGH (ref 11.1–15.7)
WBC: 8.5 10*3/uL (ref 3.9–10.0)

## 2012-01-29 LAB — IRON AND TIBC
%SAT: 3 % — ABNORMAL LOW (ref 20–55)
TIBC: 543 ug/dL — ABNORMAL HIGH (ref 250–470)
UIBC: 528 ug/dL — ABNORMAL HIGH (ref 125–400)

## 2012-01-29 LAB — FERRITIN: Ferritin: 7 ng/mL — ABNORMAL LOW (ref 10–291)

## 2012-01-29 NOTE — Progress Notes (Signed)
This office note has been dictated.

## 2012-02-01 NOTE — Progress Notes (Signed)
CC:   Forestine Chute, MD, Fax [phone]  DIAGNOSES: 1. 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 2. 3rd trimester pregnancy.  CURRENT THERAPY:  Oral iron.  INTERIM HISTORY:  Jennifer Brown comes in for her followup.  She is doing okay.  She has had no problems with the oral iron.  She says that she feels quite well.  She has had no bleeding.  She has had no nausea or vomiting.  There is some fatigue from her pregnancy.  She has some swelling in her legs.  Her left leg swells more than her right leg.  She did have a 24-hour urine done.  She is only putting out __________ mg of protein a day.  She did have some lab work done by Dr. Forestine Chute.  This was done recently.  She did have some mildly elevated alkaline phosphatase.  Her hemoglobin was 7.3.  PHYSICAL EXAMINATION:  This is a pregnant black female in no obvious distress.  Vital signs:  Temperature of 97.2, pulse 106, respiratory rate 20, blood pressure 108/68.  Weight is 195.  Head and neck:  A normocephalic, atraumatic skull.  There are no ocular or oral lesions. There are no palpable cervical or supraclavicular lymph nodes.  Lungs: Clear to percussion and auscultation bilaterally.  Cardiac:  Regular rate and rhythm with a normal S1 and S2.  She has a 1/6 systolic ejection murmur.  ABDOMEN:  A Pregnant abdomen.  Abdomen is nontender. She has no obvious fluid wave.  There is no palpable hepatosplenomegaly. Extremities:  1+ nonpitting edema in the left leg.  Neurologic:  No focal neurological deficits.  Skin:  No rashes, ecchymosis or petechia.  LABORATORY STUDIES:  White cell count is 8.5, hemoglobin 7.8, hematocrit 26.9, platelet count 231.  MCV is 64.  IMPRESSION:  Jennifer Brown is a 31 year old African American female with a history of iron-deficiency anemia.  She is on oral iron.  Her hemoglobin is improving.  We will have to see what her iron studies show.  I think that as long as the hemoglobin is improving, and also  that her MCV is going up, this is a good indicator that the iron is being absorbed.  I still would like to avoid IV iron for right now.  We will go ahead and plan to get her back in 3 weeks for followup.    ______________________________ Josph Macho, M.D. PRE/MEDQ  D:  01/29/2012  T:  01/29/2012  Job:  [id number]

## 2012-02-09 ENCOUNTER — Other Ambulatory Visit: Payer: Self-pay | Admitting: Hematology & Oncology

## 2012-02-09 DIAGNOSIS — D509 Iron deficiency anemia, unspecified: Secondary | ICD-10-CM

## 2012-02-12 ENCOUNTER — Other Ambulatory Visit: Payer: Self-pay | Admitting: Hematology & Oncology

## 2012-02-12 ENCOUNTER — Telehealth: Payer: Self-pay | Admitting: *Deleted

## 2012-02-12 DIAGNOSIS — D509 Iron deficiency anemia, unspecified: Secondary | ICD-10-CM

## 2012-02-12 NOTE — Telephone Encounter (Signed)
Spoke to pt. To receive FeraHeme 510mg  IV weekly x 2 at [facility] starting on 02/15/12. She was made aware to check in at [facility] according Lupita Leash, RN (charge nurse). The best time is around 0700. She verbalized understanding of the instructions and will let her OB know of the dates of her infusions.

## 2012-02-15 ENCOUNTER — Inpatient Hospital Stay (HOSPITAL_COMMUNITY)
Admission: AD | Admit: 2012-02-15 | Discharge: 2012-02-15 | Disposition: A | Discharge status: Home / Self Care | Payer: Medicaid Other | Source: Ambulatory Visit | Attending: Obstetrics and Gynecology | Admitting: Obstetrics and Gynecology

## 2012-02-15 DIAGNOSIS — O99019 Anemia complicating pregnancy, unspecified trimester: Secondary | ICD-10-CM | POA: Insufficient documentation

## 2012-02-15 DIAGNOSIS — D509 Iron deficiency anemia, unspecified: Secondary | ICD-10-CM | POA: Insufficient documentation

## 2012-02-15 MED ORDER — FERUMOXYTOL INJECTION 510 MG/17 ML
510.0000 mg | INTRAVENOUS | Status: DC
  Administered 2012-02-15: 510 mg via INTRAVENOUS
  Filled 2012-02-15: qty 17

## 2012-02-15 NOTE — Progress Notes (Signed)
Pt came in to MAU this am for IV Iron infusion. Pt currently without complaints. VSS. #18g angiocath inserted into R) AC without difficulty. POC explained to pt and husband with understanding verbalized. 510 mg Feraheme given IV slow push over 15 minutes then flushed with NS. VSS and FHR 135 following infusion. PIV d/c'd with tip intact and site CDI. Contacted MD office for follow-up instructions.

## 2012-02-19 ENCOUNTER — Other Ambulatory Visit: Payer: Medicaid Other | Admitting: Lab

## 2012-02-19 ENCOUNTER — Ambulatory Visit: Payer: Medicaid Other | Admitting: Hematology & Oncology

## 2012-02-22 ENCOUNTER — Inpatient Hospital Stay (HOSPITAL_COMMUNITY)
Admission: AD | Admit: 2012-02-22 | Discharge: 2012-02-22 | Disposition: A | Discharge status: Home / Self Care | Payer: Medicaid Other | Source: Ambulatory Visit | Attending: Obstetrics and Gynecology | Admitting: Obstetrics and Gynecology

## 2012-02-22 DIAGNOSIS — O99019 Anemia complicating pregnancy, unspecified trimester: Secondary | ICD-10-CM | POA: Insufficient documentation

## 2012-02-22 DIAGNOSIS — O99840 Bariatric surgery status complicating pregnancy, unspecified trimester: Secondary | ICD-10-CM | POA: Insufficient documentation

## 2012-02-22 DIAGNOSIS — D508 Other iron deficiency anemias: Secondary | ICD-10-CM | POA: Insufficient documentation

## 2012-02-22 MED ORDER — FERUMOXYTOL INJECTION 510 MG/17 ML
510.0000 mg | Freq: Once | INTRAVENOUS | Status: AC
  Administered 2012-02-22: 510 mg via INTRAVENOUS
  Filled 2012-02-22: qty 17

## 2012-02-22 MED ORDER — SODIUM CHLORIDE 0.9 % IJ SOLN
INTRAMUSCULAR | Status: AC
  Administered 2012-02-22: 3 mL
  Filled 2012-02-22: qty 6

## 2012-02-22 NOTE — MAU Note (Addendum)
Office note(Dr Ennever/BicklingRN) reviewed with Dr Neva Seat- confirmed with pharmacy this is what Ms Summerson received last last- will proceed

## 2012-03-03 ENCOUNTER — Other Ambulatory Visit: Payer: Self-pay | Admitting: Hematology & Oncology

## 2012-03-03 ENCOUNTER — Telehealth: Payer: Self-pay | Admitting: *Deleted

## 2012-03-03 ENCOUNTER — Telehealth: Payer: Self-pay | Admitting: Hematology & Oncology

## 2012-03-03 DIAGNOSIS — D509 Iron deficiency anemia, unspecified: Secondary | ICD-10-CM

## 2012-03-03 NOTE — Telephone Encounter (Signed)
LEFT PT MESSAGE WITH 7-29 APPOINTMENT TIME

## 2012-03-03 NOTE — Telephone Encounter (Signed)
Pt called stating she had her labs checked and her iron went from 7 to 9 and her OB wants her to have one more iron infusion before she delivers. Asked if she meant Hgb and she said "no, I'm pretty sure it was my iron level". She said she is no longer eating ice and feels GREAT! Due to have her c-section on 03/21/12. Obtained labs from her OB's office. Her Hgb did go from 7 to 9, not her iron. Reviewed with Dr. Myna Hidalgo. He wants her to come in early next week to see [name], PA and have her labs checked. Jennifer Brown was agreeable to this and an appt was set up for 03/07/12.

## 2012-03-07 ENCOUNTER — Other Ambulatory Visit (HOSPITAL_BASED_OUTPATIENT_CLINIC_OR_DEPARTMENT_OTHER): Payer: Medicaid Other | Admitting: Lab

## 2012-03-07 ENCOUNTER — Ambulatory Visit (HOSPITAL_BASED_OUTPATIENT_CLINIC_OR_DEPARTMENT_OTHER): Payer: Medicaid Other | Admitting: Medical

## 2012-03-07 VITALS — BP 113/67 | HR 93 | Temp 97.3°F | Ht 61.0 in | Wt 213.0 lb

## 2012-03-07 DIAGNOSIS — D509 Iron deficiency anemia, unspecified: Secondary | ICD-10-CM

## 2012-03-07 DIAGNOSIS — E611 Iron deficiency: Secondary | ICD-10-CM | POA: Insufficient documentation

## 2012-03-07 DIAGNOSIS — Z9884 Bariatric surgery status: Secondary | ICD-10-CM

## 2012-03-07 DIAGNOSIS — R609 Edema, unspecified: Secondary | ICD-10-CM

## 2012-03-07 LAB — IRON AND TIBC
%SAT: 28 % (ref 20–55)
TIBC: 406 ug/dL (ref 250–470)

## 2012-03-07 LAB — CHCC SATELLITE - SMEAR

## 2012-03-07 LAB — CBC WITH DIFFERENTIAL (CANCER CENTER ONLY)
BASO#: 0 10*3/uL (ref 0.0–0.2)
BASO%: 0.4 % (ref 0.0–2.0)
Eosinophils Absolute: 0.1 10*3/uL (ref 0.0–0.5)
HCT: 35 % (ref 34.8–46.6)
HGB: 11.1 g/dL — ABNORMAL LOW (ref 11.6–15.9)
LYMPH#: 2.3 10*3/uL (ref 0.9–3.3)
LYMPH%: 33.5 % (ref 14.0–48.0)
MCHC: 31.7 g/dL — ABNORMAL LOW (ref 32.0–36.0)
MONO#: 0.5 10*3/uL (ref 0.1–0.9)
NEUT%: 57.9 % (ref 39.6–80.0)
RBC: 4.9 10*6/uL (ref 3.70–5.32)
WBC: 7 10*3/uL (ref 3.9–10.0)

## 2012-03-07 LAB — TECHNOLOGIST REVIEW CHCC SATELLITE

## 2012-03-07 LAB — FERRITIN: Ferritin: 135 ng/mL (ref 10–291)

## 2012-03-07 NOTE — Progress Notes (Signed)
Patient Name :Jennifer Brown, Jennifer Brown MR #[id number] DOB: 1981/06/22 Encounter Date: 03/07/2012 Dictated by [name], PA-C  Diagnosis cc: #1.  Iron deficiency anemia, secondary to gastric bypass. #2/3 trimester pregnancy.  Current therapy: Oral iron.  Interim history: Jennifer Brown comes in today for an office followup visit.  Overall, she reports, she is doing quite well.  She is about to have baby  #5 due on 03/21/2012.  She still remains on oral iron without any problems.  She did have to have IV iron infusion.  Back on July 8 and 15th.  Her last iron panel was on 01/29/2012.  Iron was 15 with 3% saturation and a ferritin was 7.  Her labs look great.  Today.  Hemoglobin is 11.1, hematocrit 35.0, MCV is still relatively low at 71, and platelets are 133,000.  Overall, she reports, that she feels, well.  She's not having any complications.  She continues to followup with Jennifer Brown.  I will go ahead and him a copy of her CBC today.  She denies any nausea, vomiting, diarrhea, constipation, cough, chest pain, shortness of breath.  She denies any obvious, bleeding.  She does have some lower [place] he swelling, secondary to her pregnancy.  Her left leg swells more than her leg.   Review of Systems:Significan for lower extremity edema, otherwise: Pt. Denies any changes in their vision, hearing, adenopathy, fevers, chills, nausea, vomiting, diarrhea, constipation, chest pain, shortness of breath, passing blood, passing out, blacking out,  any changes in skin, joints, neurologic or psychiatric except as noted.  Physical Exam: This is a pleasant, 31 year old, pregnant, African American, female, in no obvious distress Vitals: Temperature 97.3 degrees, pulse 93, respirations 20, blood pressure 113/67, weight 213 pounds HEENT reveals a normocephalic, atraumatic skull, no scleral icterus, no oral lesions  Neck is supple without any cervical or supraclavicular adenopathy.  Lungs are clear to auscultation bilaterally.  There are no wheezes, rales or rhonci Cardiac is regular rate and rhythm with a normal S1 and S2. There are no murmurs, rubs, or bruits.  Abdomen-pregnant abdomen which  is soft with good bowel sounds there's a palpable mass. There is no palpable hepatosplenomegaly. There is no palpable fluid wave.  Musculoskeletal no tenderness of the spine, ribs, or hips.  Extremities there are no clubbing, cyanosis, 1 nonpitting edema of the left leg Skin no petechia, purpura or ecchymosis Neurologic is nonfocal.  Laboratory Data: White count 7.0, hemoglobin 11.1, hematocrit 35.0, platelets 133,000, MCV 71  Current Outpatient Prescriptions on File Prior to Visit  Medication Sig Dispense Refill  . cholecalciferol (VITAMIN D) 1000 UNITS tablet Take 1,000 Units by mouth daily.      . Fe Fum-FePoly-FA-Vit C-Vit B3 (INTEGRA F) 125-1 MG CAPS Take 2 capsules by mouth daily.       . Prenatal Vit-Fe Fumarate-FA (PRENATAL MULTIVITAMIN) TABS Take 1 tablet by mouth every morning.      . vitamin B-12 (CYANOCOBALAMIN) 100 MCG tablet Take 50 mcg by mouth daily.      . Doxylamine Succinate, Sleep, (UNISOM PO) Take 1 tablet by mouth at bedtime as needed. For sleep       Assessment/Plan: This is a pleasant, pregnant 31 year old, African American, female, with the following issues. #1.  History of iron deficiency anemia, secondary to gastric bypass-she remains on oral iron.  She did have to have an IV iron infusion.  Back on July 8 and 15th.  Her hemoglobin looks great at 11.1.  At this point.  She should,  hopefully, not have to have any more IV iron for the time being.  We are checking her iron studies today.  We will check another CBC just prior to her delivery date, which is scheduled for 03/21/2012.  #2 followup we will follow back up with Jennifer Brown 4-6 weeks after her delivery did check her CBC and an iron panel.  Again, we will followup with her in about 2 weeks with a CBC just prior to her delivery date.  We will plan  on seeing her back before then should there be any questions or concerns.

## 2012-03-09 ENCOUNTER — Encounter (HOSPITAL_COMMUNITY): Payer: Self-pay | Admitting: Pharmacist

## 2012-03-10 ENCOUNTER — Inpatient Hospital Stay (EMERGENCY_DEPARTMENT_HOSPITAL)
Admission: AD | Admit: 2012-03-10 | Discharge: 2012-03-11 | Disposition: A | Discharge status: Hospice | Payer: Medicaid Other | Source: Ambulatory Visit | Attending: Obstetrics & Gynecology | Admitting: Obstetrics & Gynecology

## 2012-03-10 ENCOUNTER — Encounter (HOSPITAL_COMMUNITY): Payer: Self-pay

## 2012-03-10 DIAGNOSIS — O479 False labor, unspecified: Secondary | ICD-10-CM | POA: Insufficient documentation

## 2012-03-10 DIAGNOSIS — D696 Thrombocytopenia, unspecified: Secondary | ICD-10-CM | POA: Insufficient documentation

## 2012-03-10 DIAGNOSIS — O99119 Other diseases of the blood and blood-forming organs and certain disorders involving the immune mechanism complicating pregnancy, unspecified trimester: Secondary | ICD-10-CM

## 2012-03-10 DIAGNOSIS — D689 Coagulation defect, unspecified: Secondary | ICD-10-CM | POA: Insufficient documentation

## 2012-03-10 DIAGNOSIS — Z98891 History of uterine scar from previous surgery: Secondary | ICD-10-CM

## 2012-03-10 HISTORY — DX: Depression, unspecified: F32.A

## 2012-03-10 HISTORY — DX: Major depressive disorder, single episode, unspecified: F32.9

## 2012-03-10 LAB — URINALYSIS, ROUTINE W REFLEX MICROSCOPIC
Bilirubin Urine: NEGATIVE
Glucose, UA: NEGATIVE mg/dL
Ketones, ur: >80 mg/dL — AB
Leukocytes, UA: NEGATIVE
Nitrite: NEGATIVE
Protein, ur: NEGATIVE mg/dL

## 2012-03-10 LAB — CBC
HCT: 35.2 % — ABNORMAL LOW (ref 36.0–46.0)
Hemoglobin: 11.2 g/dL — ABNORMAL LOW (ref 12.0–15.0)
MCH: 22.9 pg — ABNORMAL LOW (ref 26.0–34.0)
MCHC: 31.8 g/dL (ref 30.0–36.0)

## 2012-03-10 LAB — OB RESULTS CONSOLE VARICELLA ZOSTER ANTIBODY, IGG: Varicella: IMMUNE

## 2012-03-10 LAB — OB RESULTS CONSOLE RUBELLA ANTIBODY, IGM: Rubella: IMMUNE

## 2012-03-10 LAB — OB RESULTS CONSOLE GC/CHLAMYDIA: Gonorrhea: NEGATIVE

## 2012-03-10 LAB — OB RESULTS CONSOLE HIV ANTIBODY (ROUTINE TESTING): HIV: NONREACTIVE

## 2012-03-10 LAB — OB RESULTS CONSOLE RPR: RPR: NONREACTIVE

## 2012-03-10 MED ORDER — DEXTROSE 5 % IN LACTATED RINGERS IV BOLUS
1000.0000 mL | Freq: Once | INTRAVENOUS | Status: AC
  Administered 2012-03-10: 1000 mL via INTRAVENOUS

## 2012-03-10 NOTE — MAU Note (Signed)
Pt presents with contractions since 1525 this pm q 10-15 minutes.  Pt also with + FM, no pih symptoms x bilateral lower extremity swelling, pt denies leaking of fluid or bleeding.

## 2012-03-10 NOTE — MAU Note (Signed)
Pt states has been having ctx's  On/off. "Llost count", unsure if she is dilated, has had increased swelling around feet/legs, feet are extremely swollen and tender. Denies bleeding or abnormal vaginal discharge.

## 2012-03-10 NOTE — MAU Provider Note (Signed)
Chief Complaint:  Labor Eval   First Provider Initiated Contact with Patient 03/10/12 2027      HPI  Jennifer KEEVEN is a 31 y.o. G4P3003 at [redacted]w[redacted]d presenting with contractions and pelvic pressure. She reports good fetal movement, denies LOF, vaginal bleeding, vaginal itching/burning, urinary symptoms, h/a, dizziness, n/v, or fever/chills.  She does report swelling in both legs/feet that she has had for several weeks and is painful for her.    Pregnancy Course: uncomplicated  Past Medical History: Past Medical History  Diagnosis Date  . Hx of gastric bypass 2004  . Morbid obesity   . Anemia   . Substance abuse     Tobacco, ETOH    Past Surgical History: Past Surgical History  Procedure Date  . Cesarean section 2001, 2005, 2009  . Gastric bypass 01/23/03  . Breast surgery 12/25/08    Reduction  . Hernia repair 11/13/04  . Cholecystectomy 08/09/02    Family History: No family history on file.  Social History: History  Substance Use Topics  . Smoking status: Unknown If Ever Smoked  . Smokeless tobacco: Not on file  . Alcohol Use: Not on file    Allergies: No Known Allergies  Meds:  Prescriptions prior to admission  Medication Sig Dispense Refill  . cholecalciferol (VITAMIN D) 1000 UNITS tablet Take 1,000 Units by mouth daily.      . Doxylamine Succinate, Sleep, (UNISOM PO) Take 1 tablet by mouth at bedtime as needed. For sleep      . Fe Fum-FePoly-FA-Vit C-Vit B3 (INTEGRA F) 125-1 MG CAPS Take 2 capsules by mouth daily.       . Prenatal Vit-Fe Fumarate-FA (PRENATAL MULTIVITAMIN) TABS Take 1 tablet by mouth every morning.      . vitamin B-12 (CYANOCOBALAMIN) 100 MCG tablet Take 50 mcg by mouth daily.          Physical Exam  Blood pressure 116/68, pulse 78, resp. rate 16, height 5\' 1"  (1.549 m), weight 97.693 kg (215 lb 6 oz), last menstrual period 06/22/2011, SpO2 100.00%.  Patient Vitals for the past 24 hrs:  BP Pulse Resp SpO2 Height Weight  03/10/12 2337 97/51  mmHg 64  16  - - -  03/10/12 2130 - - 18  - - -  03/10/12 2033 - - 18  - - -  03/10/12 1953 130/88 mmHg 80  18  - - -  03/10/12 1904 116/68 mmHg 78  16  100 % 5\' 1"  (1.549 m) 97.693 kg (215 lb 6 oz)    GENERAL: Well-developed, well-nourished female in no acute distress.  HEENT: normocephalic, good dentition HEART: normal rate RESP: normal effort ABDOMEN: Soft, nontender, gravid appropriate for gestational age Extremities - pedal edema, nonpitting, skin on legs tight and shiny, +2 to +3, intact peripheral pulses, Homan's sign negative bilaterally NEURO: alert and oriented   Dilation: Closed Effacement (%): Thick Cervical Position: Posterior Station: -2 Exam by:: L Leftwhich-Kirby, CNM  FHT:  Baseline 135 , moderate variability, accelerations present, no decelerations Contractions: q 4-8 mins, lasting 50 seconds  2 hours after start of IV hydration, FHR remains Category I and Ctx occasional   Labs: Results for orders placed during the hospital encounter of 03/10/12 (from the past 24 hour(s))  URINALYSIS, ROUTINE W REFLEX MICROSCOPIC     Status: Abnormal   Collection Time   03/10/12  8:40 PM      Component Value Range   Color, Urine YELLOW  YELLOW   APPearance HAZY (*) CLEAR  Specific Gravity, Urine >1.030 (*) 1.005 - 1.030   pH 6.0  5.0 - 8.0   Glucose, UA NEGATIVE  NEGATIVE mg/dL   Hgb urine dipstick NEGATIVE  NEGATIVE   Bilirubin Urine NEGATIVE  NEGATIVE   Ketones, ur >80 (*) NEGATIVE mg/dL   Protein, ur NEGATIVE  NEGATIVE mg/dL   Urobilinogen, UA 1.0  0.0 - 1.0 mg/dL   Nitrite NEGATIVE  NEGATIVE   Leukocytes, UA NEGATIVE  NEGATIVE  CBC     Status: Abnormal   Collection Time   03/10/12  9:40 PM      Component Value Range   WBC 8.2  4.0 - 10.5 K/uL   RBC 4.89  3.87 - 5.11 MIL/uL   Hemoglobin 11.2 (*) 12.0 - 15.0 g/dL   HCT 86.5 (*) 78.4 - 69.6 %   MCV 72.0 (*) 78.0 - 100.0 fL   MCH 22.9 (*) 26.0 - 34.0 pg   MCHC 31.8  30.0 - 36.0 g/dL   RDW NOT CALCULATED  29.5 -  15.5 %   Platelets 116 (*) 150 - 400 K/uL   CMP     Component Value Date/Time   NA 137 03/10/2012 2342   K 3.1* 03/10/2012 2342   CL 105 03/10/2012 2342   CO2 22 03/10/2012 2342   GLUCOSE 126* 03/10/2012 2342   BUN 6 03/10/2012 2342   CREATININE 0.47* 03/10/2012 2342   CALCIUM 8.7 03/10/2012 2342   PROT 5.6* 03/10/2012 2342   ALBUMIN 2.2* 03/10/2012 2342   AST 20 03/10/2012 2342   ALT 7 03/10/2012 2342   ALKPHOS 140* 03/10/2012 2342   BILITOT 0.3 03/10/2012 2342   GFRNONAA >90 03/10/2012 2342   GFRAA >90 03/10/2012 2342     Assessment: Threatened labor at term with previous C/S x3 Thrombocytopenia complicating pregnancy  Plan: Called Dr Richardson Dopp to discuss assessment and findings D5LR x1000 ml and CBC in MAU Called Dr Richardson Dopp to discuss platelet results Serum creatinine and AST/ALT  D/C home with HELLP/preeclampsia precautions Call office tomorrow to schedule f/u CBC on Monday Return to MAU as needed    LEFTWICH-KIRBY, LISA 8/1/20138:35 PM

## 2012-03-11 ENCOUNTER — Inpatient Hospital Stay (HOSPITAL_COMMUNITY)
Admission: AD | Admit: 2012-03-11 | Discharge: 2012-03-11 | Disposition: A | Discharge status: Home / Self Care | Payer: Medicaid Other | Source: Ambulatory Visit | Attending: Obstetrics and Gynecology | Admitting: Obstetrics and Gynecology

## 2012-03-11 ENCOUNTER — Encounter (HOSPITAL_COMMUNITY): Payer: Self-pay | Admitting: *Deleted

## 2012-03-11 DIAGNOSIS — Z98891 History of uterine scar from previous surgery: Secondary | ICD-10-CM

## 2012-03-11 DIAGNOSIS — O479 False labor, unspecified: Secondary | ICD-10-CM

## 2012-03-11 HISTORY — DX: Unspecified ovarian cyst, unspecified side: N83.209

## 2012-03-11 HISTORY — DX: Urinary tract infection, site not specified: N39.0

## 2012-03-11 LAB — URINALYSIS, ROUTINE W REFLEX MICROSCOPIC
Bilirubin Urine: NEGATIVE
Nitrite: NEGATIVE
Protein, ur: NEGATIVE mg/dL
Specific Gravity, Urine: 1.02 (ref 1.005–1.030)
Urobilinogen, UA: 0.2 mg/dL (ref 0.0–1.0)

## 2012-03-11 LAB — COMPREHENSIVE METABOLIC PANEL
ALT: 7 U/L (ref 0–35)
CO2: 22 mEq/L (ref 19–32)
Calcium: 8.7 mg/dL (ref 8.4–10.5)
Chloride: 105 mEq/L (ref 96–112)
Creatinine, Ser: 0.47 mg/dL — ABNORMAL LOW (ref 0.50–1.10)
GFR calc Af Amer: >90 mL/min (ref >90)
GFR calc non Af Amer: >90 mL/min (ref >90)
Glucose, Bld: 126 mg/dL — ABNORMAL HIGH (ref 70–99)
Total Bilirubin: 0.3 mg/dL (ref 0.3–1.2)

## 2012-03-11 LAB — CBC
MCH: 22.3 pg — ABNORMAL LOW (ref 26.0–34.0)
MCV: 72.8 fL — ABNORMAL LOW (ref 78.0–100.0)
Platelets: 135 10*3/uL — ABNORMAL LOW (ref 150–400)
RBC: 5.07 MIL/uL (ref 3.87–5.11)
RDW: 30.4 % — ABNORMAL HIGH (ref 11.5–15.5)

## 2012-03-11 MED ORDER — ZOLPIDEM TARTRATE 5 MG PO TABS: 5.0000 mg | ORAL_TABLET | Freq: Every evening | ORAL | Status: DC | PRN

## 2012-03-11 MED ORDER — SODIUM CHLORIDE 0.9 % IJ SOLN
INTRAMUSCULAR | Status: AC
  Filled 2012-03-11: qty 3

## 2012-03-11 MED ORDER — LACTATED RINGERS IV BOLUS (SEPSIS)
500.0000 mL | Freq: Once | INTRAVENOUS | Status: AC
  Administered 2012-03-11: 18:00:00 via INTRAVENOUS

## 2012-03-11 NOTE — MAU Note (Signed)
Increase frequency in contractions.  Denies bleeding or leaking.

## 2012-03-16 ENCOUNTER — Encounter (HOSPITAL_COMMUNITY)
Admission: RE | Admit: 2012-03-16 | Discharge: 2012-03-16 | Disposition: A | Discharge status: Home / Self Care | Payer: Medicaid Other | Source: Ambulatory Visit | Attending: Obstetrics and Gynecology | Admitting: Obstetrics and Gynecology

## 2012-03-16 ENCOUNTER — Encounter (HOSPITAL_COMMUNITY): Payer: Self-pay | Admitting: Anesthesiology

## 2012-03-16 ENCOUNTER — Encounter (HOSPITAL_COMMUNITY): Payer: Self-pay

## 2012-03-16 ENCOUNTER — Encounter (HOSPITAL_COMMUNITY): Payer: Self-pay | Admitting: *Deleted

## 2012-03-16 ENCOUNTER — Inpatient Hospital Stay (HOSPITAL_COMMUNITY): Payer: Medicaid Other | Admitting: Anesthesiology

## 2012-03-16 ENCOUNTER — Encounter (HOSPITAL_COMMUNITY)
Admission: AD | Disposition: A | Discharge status: Home / Self Care | Payer: Self-pay | Source: Ambulatory Visit | Attending: Obstetrics & Gynecology

## 2012-03-16 ENCOUNTER — Inpatient Hospital Stay (HOSPITAL_COMMUNITY)
Admission: AD | Admit: 2012-03-16 | Discharge: 2012-03-20 | DRG: 766 | Disposition: A | Discharge status: Home / Self Care | Payer: Medicaid Other | Source: Ambulatory Visit | Attending: Obstetrics & Gynecology | Admitting: Obstetrics & Gynecology

## 2012-03-16 DIAGNOSIS — D509 Iron deficiency anemia, unspecified: Secondary | ICD-10-CM | POA: Diagnosis present

## 2012-03-16 DIAGNOSIS — Z98891 History of uterine scar from previous surgery: Secondary | ICD-10-CM

## 2012-03-16 DIAGNOSIS — O9902 Anemia complicating childbirth: Secondary | ICD-10-CM | POA: Diagnosis present

## 2012-03-16 DIAGNOSIS — Z01818 Encounter for other preprocedural examination: Secondary | ICD-10-CM

## 2012-03-16 DIAGNOSIS — Z302 Encounter for sterilization: Secondary | ICD-10-CM

## 2012-03-16 DIAGNOSIS — O34219 Maternal care for unspecified type scar from previous cesarean delivery: Principal | ICD-10-CM | POA: Diagnosis present

## 2012-03-16 DIAGNOSIS — Z01812 Encounter for preprocedural laboratory examination: Secondary | ICD-10-CM

## 2012-03-16 DIAGNOSIS — O99844 Bariatric surgery status complicating childbirth: Secondary | ICD-10-CM | POA: Diagnosis present

## 2012-03-16 HISTORY — DX: Insomnia, unspecified: G47.00

## 2012-03-16 LAB — URINALYSIS, ROUTINE W REFLEX MICROSCOPIC
Bilirubin Urine: NEGATIVE
Glucose, UA: NEGATIVE mg/dL
Hgb urine dipstick: NEGATIVE
Specific Gravity, Urine: <1.005 — ABNORMAL LOW (ref 1.005–1.030)
Urobilinogen, UA: 0.2 mg/dL (ref 0.0–1.0)
pH: 6.5 (ref 5.0–8.0)

## 2012-03-16 LAB — CBC
MCH: 23 pg — ABNORMAL LOW (ref 26.0–34.0)
MCHC: 31.4 g/dL (ref 30.0–36.0)
MCV: 73.3 fL — ABNORMAL LOW (ref 78.0–100.0)
Platelets: 116 10*3/uL — ABNORMAL LOW (ref 150–400)
RBC: 5.21 MIL/uL — ABNORMAL HIGH (ref 3.87–5.11)
RDW: 29.8 % — ABNORMAL HIGH (ref 11.5–15.5)

## 2012-03-16 LAB — SURGICAL PCR SCREEN: MRSA, PCR: NEGATIVE

## 2012-03-16 LAB — TYPE AND SCREEN: ABO/RH(D): A POS

## 2012-03-16 SURGERY — Surgical Case
Anesthesia: Spinal | Site: Abdomen | Wound class: Clean Contaminated

## 2012-03-16 SURGERY — Surgical Case
Anesthesia: Regional

## 2012-03-16 MED ORDER — OXYTOCIN 40 UNITS IN LACTATED RINGERS INFUSION - SIMPLE MED: 62.5000 mL/h | INTRAVENOUS | Status: AC

## 2012-03-16 MED ORDER — SCOPOLAMINE 1 MG/3DAYS TD PT72
1.0000 | MEDICATED_PATCH | Freq: Once | TRANSDERMAL | Status: AC
  Administered 2012-03-16: 1.5 mg via TRANSDERMAL

## 2012-03-16 MED ORDER — FENTANYL CITRATE 0.05 MG/ML IJ SOLN
INTRAMUSCULAR | Status: AC
  Administered 2012-03-16: 50 ug via INTRAVENOUS
  Filled 2012-03-16: qty 2

## 2012-03-16 MED ORDER — ONDANSETRON HCL 4 MG PO TABS: 4.0000 mg | ORAL_TABLET | ORAL | Status: DC | PRN

## 2012-03-16 MED ORDER — ONDANSETRON HCL 4 MG/2ML IJ SOLN: 4.0000 mg | Freq: Three times a day (TID) | INTRAMUSCULAR | Status: DC | PRN

## 2012-03-16 MED ORDER — SODIUM CHLORIDE 0.9 % IV SOLN: 1.0000 ug/kg/h | INTRAVENOUS | Status: DC | PRN

## 2012-03-16 MED ORDER — OXYCODONE-ACETAMINOPHEN 5-325 MG PO TABS
1.0000 | ORAL_TABLET | ORAL | Status: DC | PRN
Start: 2012-03-16 — End: 2012-03-19
  Administered 2012-03-17: 1 via ORAL
  Administered 2012-03-17 (×2): 2 via ORAL
  Administered 2012-03-17 – 2012-03-18 (×2): 1 via ORAL
  Administered 2012-03-18: 2 via ORAL
  Administered 2012-03-18: 1 via ORAL
  Administered 2012-03-18: 2 via ORAL
  Administered 2012-03-18: 1 via ORAL
  Administered 2012-03-18 – 2012-03-19 (×4): 2 via ORAL
  Filled 2012-03-16 (×2): qty 2
  Filled 2012-03-16 (×2): qty 1
  Filled 2012-03-16 (×2): qty 2
  Filled 2012-03-16 (×2): qty 1
  Filled 2012-03-16 (×5): qty 2

## 2012-03-16 MED ORDER — KETOROLAC TROMETHAMINE 30 MG/ML IJ SOLN: 30.0000 mg | Freq: Four times a day (QID) | INTRAMUSCULAR | Status: AC | PRN

## 2012-03-16 MED ORDER — FENTANYL CITRATE 0.05 MG/ML IJ SOLN
25.0000 ug | INTRAMUSCULAR | Status: DC | PRN
  Administered 2012-03-16 (×3): 50 ug via INTRAVENOUS

## 2012-03-16 MED ORDER — CITRIC ACID-SODIUM CITRATE 334-500 MG/5ML PO SOLN: 30.0000 mL | Freq: Once | ORAL | Status: DC

## 2012-03-16 MED ORDER — WITCH HAZEL-GLYCERIN EX PADS: 1.0000 "application " | MEDICATED_PAD | CUTANEOUS | Status: DC | PRN

## 2012-03-16 MED ORDER — DIBUCAINE 1 % RE OINT: 1.0000 "application " | TOPICAL_OINTMENT | RECTAL | Status: DC | PRN

## 2012-03-16 MED ORDER — KETOROLAC TROMETHAMINE 60 MG/2ML IM SOLN
60.0000 mg | Freq: Once | INTRAMUSCULAR | Status: AC | PRN
  Filled 2012-03-16: qty 2

## 2012-03-16 MED ORDER — ONDANSETRON HCL 4 MG/2ML IJ SOLN
INTRAMUSCULAR | Status: DC | PRN
  Administered 2012-03-16: 4 mg via INTRAVENOUS

## 2012-03-16 MED ORDER — KETOROLAC TROMETHAMINE 30 MG/ML IJ SOLN
30.0000 mg | Freq: Four times a day (QID) | INTRAMUSCULAR | Status: AC | PRN
  Administered 2012-03-17: 30 mg via INTRAVENOUS
  Filled 2012-03-16: qty 1

## 2012-03-16 MED ORDER — HYDROMORPHONE HCL PF 1 MG/ML IJ SOLN
INTRAMUSCULAR | Status: AC
  Filled 2012-03-16: qty 1

## 2012-03-16 MED ORDER — OXYTOCIN 10 UNIT/ML IJ SOLN
40.0000 [IU] | INTRAVENOUS | Status: DC | PRN
  Administered 2012-03-16: 40 [IU] via INTRAVENOUS

## 2012-03-16 MED ORDER — SENNOSIDES-DOCUSATE SODIUM 8.6-50 MG PO TABS
2.0000 | ORAL_TABLET | Freq: Every day | ORAL | Status: DC
  Administered 2012-03-18: 2 via ORAL
  Administered 2012-03-19: 1 via ORAL

## 2012-03-16 MED ORDER — NALBUPHINE SYRINGE 5 MG/0.5 ML
INJECTION | INTRAMUSCULAR | Status: AC
  Administered 2012-03-16: 10 mg via SUBCUTANEOUS
  Filled 2012-03-16: qty 1

## 2012-03-16 MED ORDER — PRENATAL MULTIVITAMIN CH
1.0000 | ORAL_TABLET | Freq: Every day | ORAL | Status: DC
  Administered 2012-03-17 – 2012-03-20 (×4): 1 via ORAL
  Filled 2012-03-16 (×4): qty 1

## 2012-03-16 MED ORDER — MORPHINE SULFATE (PF) 0.5 MG/ML IJ SOLN
INTRAMUSCULAR | Status: DC | PRN
  Administered 2012-03-16: 4.9 mg via INTRAVENOUS
  Administered 2012-03-16: .1 mg via INTRATHECAL

## 2012-03-16 MED ORDER — HYDROMORPHONE HCL PF 1 MG/ML IJ SOLN
INTRAMUSCULAR | Status: AC
  Administered 2012-03-16: 0.5 mg via INTRAVENOUS
  Filled 2012-03-16: qty 1

## 2012-03-16 MED ORDER — METOCLOPRAMIDE HCL 5 MG/ML IJ SOLN: 10.0000 mg | Freq: Three times a day (TID) | INTRAMUSCULAR | Status: DC | PRN

## 2012-03-16 MED ORDER — DIPHENHYDRAMINE HCL 50 MG/ML IJ SOLN
INTRAMUSCULAR | Status: DC | PRN
  Administered 2012-03-16: 50 mg via INTRAVENOUS

## 2012-03-16 MED ORDER — SIMETHICONE 80 MG PO CHEW
80.0000 mg | CHEWABLE_TABLET | Freq: Three times a day (TID) | ORAL | Status: DC
  Administered 2012-03-17 – 2012-03-20 (×11): 80 mg via ORAL

## 2012-03-16 MED ORDER — DIPHENHYDRAMINE HCL 50 MG/ML IJ SOLN: 12.5000 mg | INTRAMUSCULAR | Status: DC | PRN

## 2012-03-16 MED ORDER — EPHEDRINE SULFATE 50 MG/ML IJ SOLN
INTRAMUSCULAR | Status: DC | PRN
  Administered 2012-03-16: 5 mg via INTRAVENOUS

## 2012-03-16 MED ORDER — LANOLIN HYDROUS EX OINT: 1.0000 "application " | TOPICAL_OINTMENT | CUTANEOUS | Status: DC | PRN

## 2012-03-16 MED ORDER — LACTATED RINGERS IV SOLN
INTRAVENOUS | Status: DC | PRN
  Administered 2012-03-16 (×3): via INTRAVENOUS

## 2012-03-16 MED ORDER — HYDROMORPHONE HCL PF 1 MG/ML IJ SOLN
0.2500 mg | INTRAMUSCULAR | Status: DC | PRN
  Administered 2012-03-16 (×3): 0.5 mg via INTRAVENOUS

## 2012-03-16 MED ORDER — CEFAZOLIN SODIUM-DEXTROSE 2-3 GM-% IV SOLR
2.0000 g | INTRAVENOUS | Status: AC
  Administered 2012-03-16 (×2): 2 g via INTRAVENOUS
  Filled 2012-03-16: qty 50

## 2012-03-16 MED ORDER — SCOPOLAMINE 1 MG/3DAYS TD PT72
MEDICATED_PATCH | TRANSDERMAL | Status: AC
  Administered 2012-03-16: 1.5 mg via TRANSDERMAL
  Filled 2012-03-16: qty 1

## 2012-03-16 MED ORDER — SIMETHICONE 80 MG PO CHEW: 80.0000 mg | CHEWABLE_TABLET | ORAL | Status: DC | PRN

## 2012-03-16 MED ORDER — KETOROLAC TROMETHAMINE 30 MG/ML IJ SOLN: 15.0000 mg | Freq: Once | INTRAMUSCULAR | Status: DC | PRN

## 2012-03-16 MED ORDER — NALBUPHINE HCL 10 MG/ML IJ SOLN
5.0000 mg | INTRAMUSCULAR | Status: DC | PRN
  Administered 2012-03-16: 10 mg via SUBCUTANEOUS
  Filled 2012-03-16: qty 1

## 2012-03-16 MED ORDER — SODIUM CHLORIDE 0.9 % IJ SOLN: 3.0000 mL | INTRAMUSCULAR | Status: DC | PRN

## 2012-03-16 MED ORDER — MEPERIDINE HCL 25 MG/ML IJ SOLN: 6.2500 mg | INTRAMUSCULAR | Status: DC | PRN

## 2012-03-16 MED ORDER — NALBUPHINE HCL 10 MG/ML IJ SOLN
5.0000 mg | INTRAMUSCULAR | Status: DC | PRN
  Filled 2012-03-16: qty 1

## 2012-03-16 MED ORDER — BUPIVACAINE IN DEXTROSE 0.75-8.25 % IT SOLN
INTRATHECAL | Status: DC | PRN
  Administered 2012-03-16: 10.5 mg via INTRATHECAL

## 2012-03-16 MED ORDER — PHENYLEPHRINE HCL 10 MG/ML IJ SOLN
INTRAMUSCULAR | Status: DC | PRN
  Administered 2012-03-16 (×3): 80 ug via INTRAVENOUS
  Administered 2012-03-16: 40 ug via INTRAVENOUS

## 2012-03-16 MED ORDER — ZOLPIDEM TARTRATE 5 MG PO TABS
5.0000 mg | ORAL_TABLET | Freq: Every evening | ORAL | Status: DC | PRN
  Administered 2012-03-18: 5 mg via ORAL
  Filled 2012-03-16 (×2): qty 1

## 2012-03-16 MED ORDER — LACTATED RINGERS IV SOLN: INTRAVENOUS | Status: DC

## 2012-03-16 MED ORDER — DIPHENHYDRAMINE HCL 25 MG PO CAPS: 25.0000 mg | ORAL_CAPSULE | Freq: Four times a day (QID) | ORAL | Status: DC | PRN

## 2012-03-16 MED ORDER — PROMETHAZINE HCL 25 MG/ML IJ SOLN: 6.2500 mg | INTRAMUSCULAR | Status: DC | PRN

## 2012-03-16 MED ORDER — IBUPROFEN 600 MG PO TABS
600.0000 mg | ORAL_TABLET | Freq: Four times a day (QID) | ORAL | Status: DC
  Administered 2012-03-17 – 2012-03-19 (×6): 600 mg via ORAL
  Filled 2012-03-16 (×6): qty 1

## 2012-03-16 MED ORDER — DIPHENHYDRAMINE HCL 50 MG/ML IJ SOLN: 25.0000 mg | INTRAMUSCULAR | Status: DC | PRN

## 2012-03-16 MED ORDER — CITRIC ACID-SODIUM CITRATE 334-500 MG/5ML PO SOLN
ORAL | Status: AC
  Administered 2012-03-16: 30 mL
  Filled 2012-03-16: qty 15

## 2012-03-16 MED ORDER — DIPHENHYDRAMINE HCL 25 MG PO CAPS: 25.0000 mg | ORAL_CAPSULE | ORAL | Status: DC | PRN

## 2012-03-16 MED ORDER — MENTHOL 3 MG MT LOZG: 1.0000 | LOZENGE | OROMUCOSAL | Status: DC | PRN

## 2012-03-16 MED ORDER — TETANUS-DIPHTH-ACELL PERTUSSIS 5-2.5-18.5 LF-MCG/0.5 IM SUSP: 0.5000 mL | Freq: Once | INTRAMUSCULAR | Status: DC

## 2012-03-16 MED ORDER — FENTANYL CITRATE 0.05 MG/ML IJ SOLN
INTRAMUSCULAR | Status: DC | PRN
  Administered 2012-03-16: 87.5 ug via INTRAVENOUS
  Administered 2012-03-16: 100 ug via INTRAVENOUS
  Administered 2012-03-16: 12.5 ug via INTRATHECAL

## 2012-03-16 MED ORDER — NALOXONE HCL 0.4 MG/ML IJ SOLN: 0.4000 mg | INTRAMUSCULAR | Status: DC | PRN

## 2012-03-16 MED ORDER — ONDANSETRON HCL 4 MG/2ML IJ SOLN: 4.0000 mg | INTRAMUSCULAR | Status: DC | PRN

## 2012-03-16 SURGICAL SUPPLY — 27 items
BENZOIN TINCTURE PRP APPL 2/3 (GAUZE/BANDAGES/DRESSINGS) IMPLANT
CLOTH BEACON ORANGE TIMEOUT ST (SAFETY) IMPLANT
ELECT REM PT RETURN 9FT ADLT (ELECTROSURGICAL)
ELECTRODE REM PT RTRN 9FT ADLT (ELECTROSURGICAL) IMPLANT
EXTRACTOR VACUUM M CUP 4 TUBE (SUCTIONS) IMPLANT
GLOVE BIO SURGEON STRL SZ 6.5 (GLOVE) IMPLANT
GLOVE BIO SURGEON STRL SZ7.5 (GLOVE) IMPLANT
GLOVE BIOGEL PI IND STRL 7.0 (GLOVE) IMPLANT
GLOVE BIOGEL PI IND STRL 8 (GLOVE) IMPLANT
GLOVE BIOGEL PI INDICATOR 7.0 (GLOVE)
GLOVE BIOGEL PI INDICATOR 8 (GLOVE)
GOWN PREVENTION PLUS LG XLONG (DISPOSABLE) IMPLANT
GOWN PREVENTION PLUS XLARGE (GOWN DISPOSABLE) IMPLANT
GOWN STRL REIN XL XLG (GOWN DISPOSABLE) IMPLANT
KIT ABG SYR 3ML LUER SLIP (SYRINGE) IMPLANT
NEEDLE HYPO 25X5/8 SAFETYGLIDE (NEEDLE) IMPLANT
NS IRRIG 1000ML POUR BTL (IV SOLUTION) IMPLANT
PACK C SECTION WH (CUSTOM PROCEDURE TRAY) IMPLANT
SLEEVE SCD COMPRESS KNEE MED (MISCELLANEOUS) IMPLANT
STRIP CLOSURE SKIN 1/2X4 (GAUZE/BANDAGES/DRESSINGS) ×2 IMPLANT
SUT GUT PLAIN 0 CT-3 TAN 27 (SUTURE) IMPLANT
SUT MON AB 4-0 PS1 27 (SUTURE) IMPLANT
SUT PLAIN 2 0 XLH (SUTURE) IMPLANT
SUT VIC AB 0 CT1 36 (SUTURE) IMPLANT
TOWEL OR 17X24 6PK STRL BLUE (TOWEL DISPOSABLE) IMPLANT
TRAY FOLEY CATH 14FR (SET/KITS/TRAYS/PACK) IMPLANT
WATER STERILE IRR 1000ML POUR (IV SOLUTION) IMPLANT

## 2012-03-16 SURGICAL SUPPLY — 33 items
BENZOIN TINCTURE PRP APPL 2/3 (GAUZE/BANDAGES/DRESSINGS) ×2 IMPLANT
CLOTH BEACON ORANGE TIMEOUT ST (SAFETY) ×2 IMPLANT
DRESSING TELFA 8X3 (GAUZE/BANDAGES/DRESSINGS) ×2 IMPLANT
DRSG COVADERM 4X10 (GAUZE/BANDAGES/DRESSINGS) ×2 IMPLANT
ELECT REM PT RETURN 9FT ADLT (ELECTROSURGICAL) ×2
ELECTRODE REM PT RTRN 9FT ADLT (ELECTROSURGICAL) ×1 IMPLANT
EXTRACTOR VACUUM M CUP 4 TUBE (SUCTIONS) IMPLANT
GLOVE BIO SURGEON STRL SZ 6.5 (GLOVE) IMPLANT
GLOVE BIO SURGEON STRL SZ7.5 (GLOVE) ×2 IMPLANT
GLOVE BIOGEL PI IND STRL 7.0 (GLOVE) IMPLANT
GLOVE BIOGEL PI IND STRL 8 (GLOVE) ×2 IMPLANT
GLOVE BIOGEL PI INDICATOR 7.0 (GLOVE)
GLOVE BIOGEL PI INDICATOR 8 (GLOVE) ×2
GLOVE ECLIPSE 6.0 STRL STRAW (GLOVE) ×2 IMPLANT
GOWN PREVENTION PLUS LG XLONG (DISPOSABLE) ×2 IMPLANT
GOWN PREVENTION PLUS XLARGE (GOWN DISPOSABLE) IMPLANT
GOWN STRL REIN XL XLG (GOWN DISPOSABLE) ×2 IMPLANT
KIT ABG SYR 3ML LUER SLIP (SYRINGE) IMPLANT
NEEDLE HYPO 25X5/8 SAFETYGLIDE (NEEDLE) IMPLANT
NS IRRIG 1000ML POUR BTL (IV SOLUTION) ×2 IMPLANT
PACK C SECTION WH (CUSTOM PROCEDURE TRAY) ×2 IMPLANT
PAD ABD 7.5X8 STRL (GAUZE/BANDAGES/DRESSINGS) ×2 IMPLANT
SLEEVE SCD COMPRESS KNEE LRG (MISCELLANEOUS) ×2 IMPLANT
SLEEVE SCD COMPRESS KNEE MED (MISCELLANEOUS) ×2 IMPLANT
STRIP CLOSURE SKIN 1/2X4 (GAUZE/BANDAGES/DRESSINGS) ×2 IMPLANT
SUT GUT PLAIN 0 CT-3 TAN 27 (SUTURE) ×4 IMPLANT
SUT MON AB 4-0 PS1 27 (SUTURE) ×2 IMPLANT
SUT PLAIN 2 0 XLH (SUTURE) ×2 IMPLANT
SUT VIC AB 0 CT1 36 (SUTURE) ×8 IMPLANT
TAPE CLOTH SURG 4X10 WHT LF (GAUZE/BANDAGES/DRESSINGS) ×2 IMPLANT
TOWEL OR 17X24 6PK STRL BLUE (TOWEL DISPOSABLE) ×4 IMPLANT
TRAY FOLEY CATH 14FR (SET/KITS/TRAYS/PACK) IMPLANT
WATER STERILE IRR 1000ML POUR (IV SOLUTION) IMPLANT

## 2012-03-16 NOTE — Op Note (Addendum)
Cesarean Section Procedure Note  Indications: previous uterine incision LTCS x3  Pre-operative Diagnosis:  IUP 38 week 2 day  History cesarean delivery x3 Desires sterilization Contractions every 3-7 minutes History of anemia Status post gastric bypass.  Post-operative Diagnosis: same plus delivered plus sterilized  Surgeon: Delbert Harness.   Assistants: Dr. Levora Angel and then Dr. Neva Seat  Anesthesia: Spinal anesthesia  ASA Class: 2 Findings: Viable female infant with Apgars of blank and blank at one and 5 minutes respectively with a weight of     .  The placenta was removed intact.  Tubes and ovaries appeared within normal limits.  There was 2 small 2 cm fibroid in the posterior corpus.  There's good hemostasis.  Interceed was placed at the end of the procedure.  Good fascial integrity was noted Pathology: Mid portions of both tubes right tube was tagged Procedure Details   The patient was seen in the Holding Room. The risks, benefits, complications, treatment options, and expected outcomes were discussed with the patient.  The patient concurred with the proposed plan, giving informed consent.  The site of surgery properly noted/marked. The patient was taken to Operating Room # 1, identified as Jennifer Brown and the procedure verified as C-Section Delivery. A Time Out was held and the above information confirmed.  After induction of anesthesia, the patient was draped and prepped in the usual sterile manner. A Pfannenstiel incision was made and carried down through the subcutaneous tissue to the fascia with the use of Bovie electrocautery. Fascial incision was made and extended transversely. The fascia was separated from the underlying rectus tissue superiorly and inferiorly. The peritoneum was identified but had already been bluntly entered. Peritoneal incision was extended longitudinally.  There was lysis of adhesions performed sharply and bluntly to revealed a well-developed lower uterine  segment and the bladder was well inferior to this area and the incision was made into the uterus at this point.A low transverse uterine incision was made. Delivered from cephalic presentation was a       gram Female with Apgar scores of 9 at one minute and 9 at five minutes. After the umbilical cord was clamped and cut cord blood was obtained for evaluation. The placenta was removed intact and appeared normal.  The uterine incision was closed with running locked sutures of Vicryl. Hemostasis was observed.  Uterus was returned to the abdomen after the tubal ligation.  The tubal ligation was performed with the modified Pomeroy method.  2 sutures of 20 plain were utilized to isolate a knuckle of tube to include it with induction.  Knuckle of tissue was removed the cut ends were then cauterized.       2 previously placed lap sponges were removed from the paracolic gutters after the uterus had been returned to the abdomen.  With good hemostasis, the Interceed was placed and a T-shaped fashion. The fascia was then reapproximated with running sutures of 0 Vicryl. The skin was reapproximated with 4-0 Monocryl.  Instrument, sponge, and needle counts were correct prior the abdominal closure and at the conclusion of the case.   Findings: See above  Estimated Blood Loss:  800 mL         Drains: Foley to gravity         Total IV Fluids:          Specimens: Fallopian Tube, Right, Left and Disposition:  Sent to Pathology and placenta sent to pathology too  Implants: 1 sheet of Interceed placed in a T fashion.         Complications:  None; patient tolerated the procedure well.         Disposition: PACU - hemodynamically stable.         Condition: stable  Attending Attestation: I was present and scrubbed for the entire procedure.

## 2012-03-16 NOTE — Anesthesia Preprocedure Evaluation (Signed)
Anesthesia Evaluation  Patient identified by MRN, date of birth, ID band Patient awake    Reviewed: Allergy & Precautions, H&P , NPO status , Patient's Chart, lab work & pertinent test results  Airway Mallampati: II TM Distance: >3 FB Neck ROM: full    Dental No notable dental hx.    Pulmonary neg pulmonary ROS,    Pulmonary exam normal       Cardiovascular negative cardio ROS      Neuro/Psych PSYCHIATRIC DISORDERS Depression negative neurological ROS     GI/Hepatic negative GI ROS, Neg liver ROS,   Endo/Other  Morbid obesity  Renal/GU negative Renal ROS  negative genitourinary   Musculoskeletal   Abdominal (+) + obese,   Peds negative pediatric ROS (+)  Hematology negative hematology ROS (+)   Anesthesia Other Findings   Reproductive/Obstetrics (+) Pregnancy                           Anesthesia Physical Anesthesia Plan  ASA: III  Anesthesia Plan: Spinal   Post-op Pain Management:    Induction:   Airway Management Planned:   Additional Equipment:   Intra-op Plan:   Post-operative Plan:   Informed Consent: I have reviewed the patients History and Physical, chart, labs and discussed the procedure including the risks, benefits and alternatives for the proposed anesthesia with the patient or authorized representative who has indicated his/her understanding and acceptance.     Plan Discussed with: CRNA and Surgeon  Anesthesia Plan Comments:         Anesthesia Quick Evaluation

## 2012-03-16 NOTE — Interval H&P Note (Signed)
History and Physical Interval Note:  03/16/2012 6:16 PM  Jennifer Brown  has presented today for surgery, with the diagnosis of h/o cesarean section x3 and desires sterilization  The various methods of treatment have been discussed with the patient and family. After consideration of risks, benefits and other options for treatment, the patient has consented to  Procedure(s) (LRB): CESAREAN SECTION WITH BILATERAL TUBAL LIGATION (N/A) as a surgical intervention .  The patient's history has been reviewed, patient examined, no change in status, stable for surgery.  I have reviewed the patient's chart and labs.  Questions were answered to the patient's satisfaction.     [name]

## 2012-03-16 NOTE — MAU Note (Signed)
Pt states she started have U/C's this am about 0930.  No vaginal bleeding or ROM.  Good fetal movement.

## 2012-03-16 NOTE — Anesthesia Procedure Notes (Signed)
Spinal  Patient location during procedure: OR Start time: 03/16/2012 6:25 PM End time: 03/16/2012 6:31 PM Staffing Anesthesiologist: Sandrea Hughs Performed by: anesthesiologist  Preanesthetic Checklist Completed: patient identified, site marked, surgical consent, pre-op evaluation, timeout performed, IV checked, risks and benefits discussed and monitors and equipment checked Spinal Block Patient position: sitting Prep: DuraPrep Patient monitoring: heart rate, cardiac monitor, continuous pulse ox and blood pressure Approach: midline Location: L3-4 Injection technique: single-shot Needle Needle type: Sprotte  Needle gauge: 24 G Needle length: 9 cm Needle insertion depth: 8 cm Assessment Sensory level: T4

## 2012-03-16 NOTE — Anesthesia Postprocedure Evaluation (Signed)
Anesthesia Post Note  Patient: Jennifer Brown  Procedure(s) Performed: Procedure(s) (LRB): CESAREAN SECTION WITH BILATERAL TUBAL LIGATION (N/A)  Anesthesia type: Spinal  Patient location: PACU  Post pain: Pain level controlled  Post assessment: Post-op Vital signs reviewed  Last Vitals:  Filed Vitals:   03/16/12 2130  BP: 114/72  Pulse: 68  Temp:   Resp: 28    Post vital signs: Reviewed  Level of consciousness: awake  Complications: No apparent anesthesia complications]

## 2012-03-16 NOTE — Interval H&P Note (Signed)
History and Physical Interval Note:  03/16/2012 6:20 PM  Jennifer Brown  has presented today for surgery, with the diagnosis of h/o cesarean section x3 and desires sterilization  The various methods of treatment have been discussed with the patient and family. After consideration of risks, benefits and other options for treatment, the patient has consented to  Procedure(s) (LRB): CESAREAN SECTION WITH BILATERAL TUBAL LIGATION (N/A) as a surgical intervention .  The patient's history has been reviewed, patient examined, no change in status, stable for surgery.  I have reviewed the patient's chart and labs.  Questions were answered to the patient's satisfaction.     Cassandra Harbold H.  We had a long discussion regarding the potential for hysterectomy and also transfusion risk and she understood and wished to proceed

## 2012-03-16 NOTE — Patient Instructions (Addendum)
Your procedure is scheduled on:03/21/12 Enter through the Main Entrance at :6am Pick up desk phone and dial [phone] and inform us of your arrival.  Please call [phone] if you have any problems the morning of surgery.  Remember: Do not eat after midnight:Sunday Do not drink after:midnight Sunday....water is ok until 3:30 am Take these meds the morning of surgery with a sip of water:none  DO NOT wear jewelry, eye make-up, lipstick,body lotion, or dark fingernail polish. Do not shave for 48 hours prior to surgery.  If you are to be admitted after surgery, leave suitcase in car until your room has been assigned. Patients discharged on the day of surgery will not be allowed to drive home.   Remember to use your Hibiclens as instructed.

## 2012-03-16 NOTE — H&P (Signed)
Jennifer Brown is a 31 y.o. female presenting for  contractions and pain and pelvic pressure .  She has been having considerable increasing pain with contractions for the last 2 weeks.  She is presented to the hospital several times as well and has gotten IV hydration and sent home.  As her cervix never changes.  She's had 2 prior C-sections and is scheduled for cesarean delivery on Monday.  She also would like to have her tubes tied.  Cesarean section counseling: The patient is aware that we have decided to deliver the baby by cesarean section.  We believe this to be in the best interest of mom and/or baby at this point.  The patient is aware that the risks specific to the procedure are that there is a potential for bleeding and the possibility of a transfusion, there is a potential for damage to the uterus that would preclude her from having a future vaginal delivery, there is a potential for damage to other intra-abdominal organs to include ovaries, tubes, bladder, ureters, and bowel, sometimes recognized at the time of surgery and repaired at that time with a potential for a larger incision and/or a second surgeon.  The patient is also aware of the fact that these may not be discovered until later date and that she would need to let us know about any postoperative complications by calling me, the office or proceeding to the emergency room.  If these complications are none found at the time of surgery and are discovered later date may need surgical repair at that time.  The patient is also aware the potential for infection and the potential for prolonged hospital stay and the potential for antibiotics on an inpatient and/or outpatient basis.  Patient is aware of the potential for damage to the uterus closed by complications related to the pregnancy that may require a hysterectomy to control bleeding in the postpartum period.  She understands the above and wishes to proceed with the procedure.   The patient  was counseled on the risks benefits and alternatives of a sterilization procedure.  She is aware of the many other methods for contraception.  She is aware of the benefit of sterilization and not having to worry on an intermittent basis regarding contraception.  The risks specific to the procedure are that it is considered irreversible, that is considered permanent, that she understands she can no longer get pregnant or desire pregnancy, that the procedure has a failure rate of approximately 4-10 in a 1000 woman years.  With the failures half would be in the uterus an unwanted pregnancies and that would be ectopic pregnancies.  The pregnancy that is an ectopic is potentially fatal.  The patient would need to seek immediate medical attention if she was ever found to be pregnant in the future until location of the pregnancy could be discovered.  She is also aware that any intra-abdominal procedure could potentially cause damage to other intra-abdominal organs to include bowel, bladder, ovaries and possibly a hernia of the abdominal wall.  Sometimes these complications are recognized at the time of surgery and are remedied at that time with the potential for a mother surgeon and the potential for a larger incision, and the possibility of a prolonged hospital stay..  Sometimes these complications are not recognized at the time of surgery and would require intervention at a later date when they are discovered.  It is important to notify my office or myself or go to the emergency room  with any postoperative complications.  The patient is also aware of the increased risk of scarring and the potential development of a not satisfactory cosmetic result and the potential development of an infection that may require antibiotics as an inpatient or as an outpatient..  All questions were answered from the patient and the patient wishes to proceed and I concur.  Transfusion counseling: The patient is aware of the possibility  with this procedure of the use of blood replacement products in the form of a transfusion.  The patient is aware of the risks benefits and alternatives of these.  She is aware that we would only use these on an emergency basis or sometimes if considered to significantly reduce the risk of transfusion requirement during surgery.  The patient is aware of the risk of the each unit of transfusion having a risk of HIV being approximately 1 in 1 million and having a risk of hepatitis being 1 in 2000, with sometimes the development of chronic active hepatitis bleeding to the potential for liver failure and liver transplant, and having a low substantial risk of transfusion reaction were her body would reject the replacement when we thought she needed it.  The patient understands them wishes to proceed.     Maternal Medical History:  Reason for admission: Reason for admission: contractions.  Reason for Admission:   nauseaContractions: Onset was 13-24 hours ago.   Frequency: regular.    Prenatal complications: No bleeding, HIV, hypertension, infection, IUGR, placental abnormality, polyhydramnios, pre-eclampsia or preterm labor.     OB History    Grav Para Term Preterm Abortions TAB SAB Ect Mult Living   8 3 3  0 4 3 1  0 0 3     Past Medical History  Diagnosis Date  . Hx of gastric bypass 2004  . Morbid obesity   . Anemia   . Substance abuse     Tobacco, ETOH  . Urinary tract infection   . Ovarian cyst   . Insomnia   . Depression     no meds x 1 yr   Past Surgical History  Procedure Date  . Gastric bypass 01/23/03  . Breast surgery 12/25/08    Reduction  . Hernia repair 11/13/04  . Cholecystectomy 08/09/02  . Cesarean section 2001, 2005, 2009  . Induced abortion   . Breast reduction surgery    Family History: family history is negative for Other. admits to a family history of diabetes in a maternal grandfather also maternal grandmother had breast cancer maternal aunt had lymph node cancer  patient's father has hypertension.  Patient's father has heart disease as well. Social History:  reports that she has been smoking Cigarettes.  She has been smoking about .25 packs per day. She does not have any smokeless tobacco history on file. She reports that she does not drink alcohol or use illicit drugs.   Prenatal Transfer Tool  Maternal Diabetes: No Genetic Screening: Normal Maternal Ultrasounds/Referrals: Normal Fetal Ultrasounds or other Referrals:  None Maternal Substance Abuse:  Yes:  Type: Smoker Significant Maternal Medications:  Meds include: Other: see prenatal record Significant Maternal Lab Results:  Lab values include: Group B Strep negative Other Comments:   the patient has had severe anemia and has required 2 iron infusions.  Last hemoglobin office was 10.7  Review of Systems  Constitutional: Negative for fever, chills and weight loss.  HENT: Negative.  Negative for sore throat.   Eyes: Negative.  Negative for blurred vision and discharge.  Respiratory:  Negative.  Negative for cough, shortness of breath and wheezing.   Cardiovascular: Negative.  Negative for chest pain and palpitations.  Gastrointestinal: Positive for abdominal pain. Negative for nausea, vomiting, blood in stool and melena.  Genitourinary: Negative.  Negative for dysuria and hematuria.  Musculoskeletal: Negative.  Negative for joint pain.  Skin: Negative.  Negative for rash.  Neurological: Negative.  Negative for dizziness, loss of consciousness and headaches.  Endo/Heme/Allergies: Negative.  Does not bruise/bleed easily.  Psychiatric/Behavioral: Negative.  Negative for suicidal ideas.    Dilation: Closed Effacement (%): Thick Exam by:: L. Paschal, RN Blood pressure 109/70, pulse 77, temperature 98.1 F (36.7 C), temperature source Oral, resp. rate 18, height 5\' 1"  (1.549 m), weight 99.791 kg (220 lb), last menstrual period 06/22/2011, SpO2 100.00%. Maternal Exam:  Uterine Assessment:  Contraction strength is firm.  Contraction duration is 30 seconds. Contraction frequency is regular.   Abdomen: Patient reports the following abdominal tenderness: incisional and suprapubic.  Surgical scars: low transverse.   Estimated fetal weight is 7.5#.   Fetal presentation: vertex  Introitus: Ferning test: not done.  Nitrazine test: not done. Amniotic fluid character: not assessed.  Pelvis: of concern for delivery.   Cervix: Per RN is still closed   Physical Exam  Constitutional: She is oriented to person, place, and time. She appears well-developed and well-nourished.       Pleasant interactive  HENT:  Head: Normocephalic and atraumatic.  Mouth/Throat: No oropharyngeal exudate.  Eyes: Pupils are equal, round, and reactive to light. Right eye exhibits no discharge. Left eye exhibits no discharge.  Neck: Normal range of motion. Neck supple. No tracheal deviation present. No thyromegaly present.  Cardiovascular: Normal rate, regular rhythm and normal heart sounds.   Respiratory: Effort normal and breath sounds normal. No respiratory distress. She has no wheezes. She has no rales.  GI: There is tenderness in the suprapubic area.       Gravid and the scar is tender  Genitourinary:       Deferred at this point  Musculoskeletal: Normal range of motion.  Neurological: She is alert and oriented to person, place, and time.  Skin: Skin is warm and dry.  Psychiatric: She has a normal mood and affect. Her behavior is normal. Judgment and thought content normal.    Prenatal labs: ABO, Rh: --/--/A POS (08/07 0920) Antibody: NEG (08/07 0913) Rubella: Immune (08/01 0000) RPR: Nonreactive (08/01 0000)  HBsAg: Negative (08/01 0000)  HIV: Non-reactive (08/01 0000)  GBS: Negative (08/01 0000)   Assessment/Plan: IUP at 38 weeks and 2 days History of cesarean delivery x3 Regular contractions and abdominal pain Tobacco use Anemia History of gastric bypass obesity Depression Chronic  back pain Desire sterilization   Feel it is too risky to allow her to leave the hospital again with this regular pattern of contractions.  We'll go ahead and proceed with cesarean delivery which will be her fourth cesarean delivery. Will also proceed with bilateral tubal ligation.  Adiya Selmer H. 03/16/2012, 2:31 PM

## 2012-03-16 NOTE — Transfer of Care (Signed)
Immediate Anesthesia Transfer of Care Note  Patient: Jennifer Brown  Procedure(s) Performed: Procedure(s) (LRB): CESAREAN SECTION WITH BILATERAL TUBAL LIGATION (N/A)  Patient Location: PACU  Anesthesia Type: Spinal  Level of Consciousness: awake, alert  and oriented  Airway & Oxygen Therapy: Patient Spontanous Breathing  Post-op Assessment: Report given to PACU RN and Post -op Vital signs reviewed and stable  Post vital signs: Reviewed and stable  Complications: No apparent anesthesia complications

## 2012-03-17 ENCOUNTER — Encounter (HOSPITAL_COMMUNITY): Payer: Self-pay | Admitting: Anesthesiology

## 2012-03-17 ENCOUNTER — Encounter (HOSPITAL_COMMUNITY): Payer: Self-pay | Admitting: Obstetrics & Gynecology

## 2012-03-17 LAB — CBC
MCH: 22.8 pg — ABNORMAL LOW (ref 26.0–34.0)
MCHC: 31 g/dL (ref 30.0–36.0)
MCV: 73.6 fL — ABNORMAL LOW (ref 78.0–100.0)
Platelets: 106 10*3/uL — ABNORMAL LOW (ref 150–400)
RDW: 29.5 % — ABNORMAL HIGH (ref 11.5–15.5)
WBC: 7.5 10*3/uL (ref 4.0–10.5)

## 2012-03-17 LAB — RPR: RPR Ser Ql: NONREACTIVE

## 2012-03-17 MED ORDER — NALOXONE HCL 0.4 MG/ML IJ SOLN: 0.4000 mg | INTRAMUSCULAR | Status: DC | PRN

## 2012-03-17 MED ORDER — ONDANSETRON HCL 4 MG/2ML IJ SOLN: 4.0000 mg | Freq: Four times a day (QID) | INTRAMUSCULAR | Status: DC | PRN

## 2012-03-17 MED ORDER — SODIUM CHLORIDE 0.9 % IJ SOLN: 9.0000 mL | INTRAMUSCULAR | Status: DC | PRN

## 2012-03-17 MED ORDER — DIPHENHYDRAMINE HCL 12.5 MG/5ML PO ELIX
12.5000 mg | ORAL_SOLUTION | Freq: Four times a day (QID) | ORAL | Status: DC | PRN
  Filled 2012-03-17: qty 5

## 2012-03-17 MED ORDER — DIPHENHYDRAMINE HCL 50 MG/ML IJ SOLN: 12.5000 mg | Freq: Four times a day (QID) | INTRAMUSCULAR | Status: DC | PRN

## 2012-03-17 MED ORDER — HYDROMORPHONE 0.3 MG/ML IV SOLN
INTRAVENOUS | Status: DC
  Administered 2012-03-17: 03:00:00 via INTRAVENOUS
  Filled 2012-03-17: qty 25

## 2012-03-17 NOTE — Anesthesia Postprocedure Evaluation (Signed)
  Anesthesia Post Note  Patient: Jennifer Brown  Procedure(s) Performed: Procedure(s) (LRB): CESAREAN SECTION (N/A)  Anesthesia type: Spinal  Patient location: Mother/Baby  Post pain: Pain level controlled  Post assessment: Post-op Vital signs reviewed  Last Vitals:  Filed Vitals:   03/17/12 0754  BP: 86/59  Pulse: 88  Temp: 36.9 C  Resp: 16    Post vital signs: Reviewed  Level of consciousness: awake  Complications: No apparent anesthesia complications

## 2012-03-17 NOTE — Progress Notes (Signed)
UR chart review completed.  

## 2012-03-17 NOTE — Progress Notes (Signed)
Subjective: Postpartum Day 1: Cesarean Delivery Patient reports tolerating PO.  And is still with foley and SCDs Pain control good with PCA  Objective: Vital signs in last 24 hours: Temp:  [97.6 F (36.4 C)-98.5 F (36.9 C)] 98.3 F (36.8 C) (08/08 0300) Pulse Rate:  [68-89] 69  (08/08 0300) Resp:  [16-28] 18  (08/08 0303) BP: (95-124)/(49-80) 101/69 mmHg (08/08 0300) SpO2:  [98 %-100 %] 100 % (08/08 0300) Weight:  [99.791 kg (220 lb)] 99.791 kg (220 lb) (08/07 1236)  Physical Exam:  General: alert, cooperative and no distress Lochia: appropriate Uterine Fundus: firm Incision: healing well DVT Evaluation: No evidence of DVT seen on physical exam. Negative Homan's sign. No cords or calf tenderness. No significant calf/ankle edema.   Basename 03/17/12 0544 03/16/12 0913  HGB 10.2* 12.0  HCT 32.9* 38.2    Assessment/Plan: Status post Cesarean section. Doing well postoperatively.  Continue current care.  Jennifer Brown H. 03/17/2012, 7:41 AM

## 2012-03-17 NOTE — Addendum Note (Signed)
Addendum  created 03/17/12 0051 by Dana Allan, MD   Modules edited:Orders, PRL Based Order Sets

## 2012-03-18 ENCOUNTER — Other Ambulatory Visit: Payer: Medicaid Other | Admitting: Lab

## 2012-03-18 MED ORDER — OXYCODONE-ACETAMINOPHEN 5-325 MG PO TABS: 1.0000 | ORAL_TABLET | ORAL | Status: DC | PRN

## 2012-03-18 MED ORDER — IBUPROFEN 600 MG PO TABS: 600.0000 mg | ORAL_TABLET | Freq: Four times a day (QID) | ORAL | Status: AC

## 2012-03-18 MED ORDER — ZOLPIDEM TARTRATE 10 MG PO TABS: 10.0000 mg | ORAL_TABLET | Freq: Every evening | ORAL | Status: DC | PRN

## 2012-03-18 NOTE — Progress Notes (Signed)
Subjective: Postpartum Day 2: Cesarean Delivery Patient reports incisional pain, tolerating PO, + flatus and no problems voiding.   Has been ambulating Objective: Vital signs in last 24 hours: Temp:  [98.1 F (36.7 C)] 98.1 F (36.7 C) (08/09 0530) Pulse Rate:  [65-86] 65  (08/09 0530) Resp:  [18-20] 20  (08/09 0530) BP: (87-111)/(52-71) 98/66 mmHg (08/09 0530) SpO2:  [98 %-100 %] 100 % (08/08 1850)  Physical Exam:  General: alert, cooperative and no distress Lochia: appropriate Uterine Fundus: U. +1 appropriately tender.  I am able to move the uterus.  The uterus is not boggy and is firm Incision: healing well, no significant drainage DVT Evaluation: No evidence of DVT seen on physical exam. Negative Homan's sign.   Basename 03/17/12 0544 03/16/12 0913  HGB 10.2* 12.0  HCT 32.9* 38.2    Assessment/Plan: Status post Cesarean section. Doing well postoperatively.  Discharge home with standard precautions and return to clinic in 2 weeks.  Psalms Olarte H. 03/18/2012, 11:06 AM

## 2012-03-18 NOTE — Progress Notes (Addendum)
Patient requests Ambien.  Patient states Dr. Christell Constant told her she could take it anytime.  MD called for clarification of order.  Ok to given patient Remus Loffler at this time per MD.

## 2012-03-18 NOTE — Clinical Social Work Psychosocial (Signed)
    Clinical Social Work Department BRIEF PSYCHOSOCIAL ASSESSMENT 03/18/2012  Patient:  Jennifer Brown, Jennifer Brown     Account Number:  [id number]     Admit date:  03/16/2012  Clinical Social Worker:  Andy Gauss  Date/Time:  03/18/2012 03:18 PM  Referred by:  Physician  Date Referred:  03/18/2012 Referred for  Behavioral Health Issues   Other Referral:   Hx of depression/anxiety   Interview type:  Patient Other interview type:    PSYCHOSOCIAL DATA Living Status:  FAMILY Admitted from facility:   Level of care:   Primary support name:  Jennifer Brown Primary support relationship to patient:  SPOUSE Degree of support available:   Involved    CURRENT CONCERNS Current Concerns  Behavioral Health Issues   Other Concerns:    SOCIAL WORK ASSESSMENT / PLAN Pt acknowledges that she experienced depression/anxiety symptoms in the past.  She was prescribed Prozac, of which she took for approximately 8 months.  Pt stopped taking the medication when she thought she didn't need it anymore.  Pt told Sw that she still gets stress out about "everyday things," such as bills, but reports feeling fine overall.  Pt's spouse at the bedside and supportive.  Pt appears to be bonding well with the infant and appropriate.  Sw available to assist further if needed.   Assessment/plan status:  No Further Intervention Required Other assessment/ plan:   Information/referral to community resources:   Pt will seek mental health treatment if necessary.    PATIENT'S/FAMILY'S RESPONSE TO PLAN OF CARE: Pt and spouse receptive of consult.

## 2012-03-18 NOTE — Discharge Summary (Signed)
Obstetric Discharge Summary Reason for Admission: cesarean section Prenatal Procedures: none Intrapartum Procedures: cesarean: low cervical, transverse and tubal ligation Postpartum Procedures: none Complications-Operative and Postpartum: none HGB  Date Value Range Status  03/07/2012 11.1* 11.6 - 15.9 g/dL Final     Hemoglobin  Date Value Range Status  03/17/2012 10.2* 12.0 - 15.0 g/dL Final     HCT  Date Value Range Status  03/17/2012 32.9* 36.0 - 46.0 % Final  03/07/2012 35.0  34.8 - 46.6 % Final    Physical Exam:  General: alert, cooperative and no distress Lochia: appropriate Uterine Fundus: firm Incision: healing well DVT Evaluation: No evidence of DVT seen on physical exam. Negative Homan's sign. No cords or calf tenderness. No significant calf/ankle edema.  Discharge Diagnoses: Term Pregnancy-delivered and Status post repeat C-section with BTL-anemia of chronic iron deficiency-history of gastric bypass-  Discharge Information: Date: 03/18/2012 Activity: pelvic rest and No heavy lifting-which means anything that she has to stop talking to do she cannot lift. Diet: routine Medications: PNV, Ibuprofen, Iron, Percocet and And Tylenol and Ambien (the patient states she's been unable to sleep in the fact that she has help at home.  We will give her 15 Ambien.) Condition: stable, improved and Delivered and sterilized Instructions: refer to practice specific booklet and .disch Discharge to: home   Newborn Data: Live born female  Birth Weight: 6 lb 9.6 oz (2995 g) APGAR: 9, 9  Home with mother.  Bryn Saline H. 03/18/2012, 11:09 AM

## 2012-03-18 NOTE — Progress Notes (Signed)
Patient refused ambien at this time.  Patient has company and will call for [place] when company leaves

## 2012-03-19 LAB — COMPREHENSIVE METABOLIC PANEL
ALT: 9 U/L (ref 0–35)
AST: 16 U/L (ref 0–37)
Albumin: 2 g/dL — ABNORMAL LOW (ref 3.5–5.2)
Calcium: 8.1 mg/dL — ABNORMAL LOW (ref 8.4–10.5)
Chloride: 104 mEq/L (ref 96–112)
Creatinine, Ser: 0.57 mg/dL (ref 0.50–1.10)
Sodium: 136 mEq/L (ref 135–145)
Total Bilirubin: 0.2 mg/dL — ABNORMAL LOW (ref 0.3–1.2)

## 2012-03-19 LAB — CBC
MCV: 73.9 fL — ABNORMAL LOW (ref 78.0–100.0)
Platelets: 135 10*3/uL — ABNORMAL LOW (ref 150–400)
WBC: 5.5 10*3/uL (ref 4.0–10.5)

## 2012-03-19 MED ORDER — WITCH HAZEL-GLYCERIN EX PADS: 1.0000 "application " | MEDICATED_PAD | CUTANEOUS | Status: DC | PRN

## 2012-03-19 MED ORDER — BISACODYL 10 MG RE SUPP
10.0000 mg | Freq: Once | RECTAL | Status: AC
  Administered 2012-03-19: 10 mg via RECTAL
  Filled 2012-03-19 (×3): qty 1

## 2012-03-19 MED ORDER — TRAMADOL HCL 50 MG PO TABS
50.0000 mg | ORAL_TABLET | Freq: Four times a day (QID) | ORAL | Status: DC | PRN
  Administered 2012-03-19: 50 mg via ORAL
  Filled 2012-03-19: qty 1

## 2012-03-19 MED ORDER — KETOROLAC TROMETHAMINE 30 MG/ML IJ SOLN
30.0000 mg | Freq: Once | INTRAMUSCULAR | Status: AC
  Administered 2012-03-19: 30 mg via INTRAMUSCULAR
  Filled 2012-03-19: qty 1

## 2012-03-19 MED ORDER — HYDROMORPHONE HCL 2 MG PO TABS
2.0000 mg | ORAL_TABLET | ORAL | Status: AC | PRN
  Administered 2012-03-19 (×2): 2 mg via ORAL
  Filled 2012-03-19 (×2): qty 1

## 2012-03-19 MED ORDER — KETOROLAC TROMETHAMINE 10 MG PO TABS
10.0000 mg | ORAL_TABLET | Freq: Four times a day (QID) | ORAL | Status: DC
  Administered 2012-03-19 – 2012-03-20 (×3): 10 mg via ORAL
  Filled 2012-03-19 (×8): qty 1

## 2012-03-19 MED ORDER — TRAMADOL HCL 50 MG PO TABS
100.0000 mg | ORAL_TABLET | Freq: Four times a day (QID) | ORAL | Status: DC | PRN
  Filled 2012-03-19: qty 2

## 2012-03-19 NOTE — Progress Notes (Signed)
Subjective: Postpartum Day 3: Cesarean Delivery Patient reports incisional pain and + flatus.  Pt states that her pain is not very well controlled.  The Percocet "doesn't work at all."  She has taken Percocet but she usually takes a higher doses. Hydrocodone makes her itch. She use to use Tramadol for 1 1/2 years with good control of dysmenorrhea but after a while it did not work.  Pt has ambulated in the halls 2-3 times today.  She has passed flatus but not had a BM. Baby is not being discharged due to weight loss greater than 10%.  Pt is breast feeding but doesn't feel he is latching well.  She wants to give the baby formula take an Ambien to sleep because she feels that fatigue is contributing to her pain and breast feeding issues.      Objective: Vital signs in last 24 hours: Temp:  [97.9 F (36.6 C)-98.1 F (36.7 C)] 98.1 F (36.7 C) (08/10 0531) Pulse Rate:  [63-66] 66  (08/10 0531) Resp:  [18] 18  (08/10 0531) BP: (103-126)/(71-85) 126/74 mmHg (08/10 0531)  Physical Exam:  General: alert, cooperative and no distress.  Pt appears very comfortable. Lochia: Not assessed at perineum Uterine Fundus: firm Incision: healing well DVT Evaluation: No evidence of DVT seen on physical exam. Calf/Ankle edema is present. 2+   Basename 03/17/12 0544  HGB 10.2*  HCT 32.9*    Assessment/Plan: Status post Cesarean section. Postoperative course complicated by poor pain control. In that baby is staying, I recommend pt remain inpatient to optimize pain control. Discontinue Percocet and Ibuprofen.  Give Toradol 30 mg IM then 10 mg po q 6 hours.  Ultram 50 mg po q 6 hours.  Informed RN to call if pain not controlled to increase Ultram to 100 mg. Encouraged ambulation. Discouraged Ambien due to nursing but peds and RN to address po needs of baby. Dulcolax suppository for BM.   Geryl Rankins 03/19/2012, 12:09 PM

## 2012-03-20 LAB — TYPE AND SCREEN
Antibody Screen: NEGATIVE
Unit division: 0

## 2012-03-20 MED ORDER — HYDROMORPHONE HCL 2 MG PO TABS
2.0000 mg | ORAL_TABLET | ORAL | Status: DC | PRN
  Administered 2012-03-20 (×2): 2 mg via ORAL
  Filled 2012-03-20: qty 1
  Filled 2012-03-20: qty 2

## 2012-03-20 MED ORDER — HYDROMORPHONE HCL 2 MG PO TABS: ORAL_TABLET | ORAL | Status: DC

## 2012-03-20 NOTE — Discharge Summary (Signed)
Obstetric Discharge Summary Reason for Admission: cesarean section Prenatal Procedures: Iron infusion (Hg 7.4) Intrapartum Procedures: cesarean: low cervical, transverse Postpartum Procedures: P.P. tubal ligation Complications-Operative and Postpartum: none.  Poor pain controlled.  Failed Percocet and Ultram.  Controlled with Dilaudid 2 mg and Toradol. HGB  Date Value Range Status  03/07/2012 11.1* 11.6 - 15.9 g/dL Final     Hemoglobin  Date Value Range Status  03/19/2012 10.4* 12.0 - 15.0 g/dL Final     HCT  Date Value Range Status  03/19/2012 33.2* 36.0 - 46.0 % Final  03/07/2012 35.0  34.8 - 46.6 % Final    Physical Exam:  WNL.  See progress note day of discharge.  Discharge Diagnoses: Term Pregnancy-delivered  Discharge Information: Date: 03/20/2012 Activity: pelvic rest and lifting restrictions. Diet: routine Medications: PNV, Ibuprofen and Dilaudid 2 mg Condition: improved Instructions: See discharge instructions. Discharge to: home Follow-up Information    Follow up with Delbert Harness., MD. Schedule an appointment as soon as possible for a visit in 2 weeks. (Post op incision check)    Contact information:   [address], [address] [facility] [employer] [place] [address] [phone]          Newborn Data: Live born female  Birth Weight: 6 lb 9.6 oz (2995 g) APGAR: 9, 9  Home with mother likely.  Discharge held on PPD #3 due to weight loss of more than 10 %.  Anes Rigel 03/20/2012, 9:30 AM

## 2012-03-20 NOTE — Progress Notes (Signed)
Subjective: Postpartum Day 4: Cesarean Delivery Yesterday, pain control was an issue. Percocet and Ibuprofen were discontinued and Ultram and Toradol scheduled were started.  The pt short lived relief with Ultram 50 mg and 100 mg.  Finally at bedtime, pain was relieved with Dilaudid 2 mg.  Patient reports tolerating PO, + BM and no problems voiding.  Ambulated 4 times in halls yesterday.  Pain controlled much better.  Objective: Vital signs in last 24 hours: Temp:  [97.8 F (36.6 C)-98.6 F (37 C)] 97.8 F (36.6 C) (08/11 0600) Pulse Rate:  [61-72] 61  (08/11 0600) Resp:  [18-20] 20  (08/11 0600) BP: (110-122)/(74-84) 119/84 mmHg (08/11 0600)  Physical Exam:  General: alert, cooperative and no distress Lochia: not assessed at perineum Uterine Fundus: firm Incision: healing well, pad at incision DVT Evaluation: No evidence of DVT seen on physical exam. Calf/Ankle edema is present. 2+   Basename 03/19/12 2223  HGB 10.4*  HCT 33.2*  8/10-Plts 106 to 116.  CMP wnl.  Assessment/Plan: Status post Cesarean section. Doing well postoperatively.  Discharge home with Ibuprofen and Dilaudid. Counseled on postpartum depression.  Encouraged pt to discuss at her 2 week post op visit.  Kenora Spayd 03/20/2012, 9:08 AM

## 2012-03-21 ENCOUNTER — Encounter (HOSPITAL_COMMUNITY): Admission: RE | Payer: Self-pay | Source: Ambulatory Visit

## 2012-03-21 ENCOUNTER — Inpatient Hospital Stay: Admit: 2012-03-21 | Payer: Self-pay | Admitting: Obstetrics & Gynecology

## 2012-03-21 ENCOUNTER — Inpatient Hospital Stay (HOSPITAL_COMMUNITY)
Admission: RE | Admit: 2012-03-21 | Payer: Medicaid Other | Source: Ambulatory Visit | Admitting: Obstetrics and Gynecology

## 2012-03-21 SURGERY — Surgical Case
Anesthesia: Regional | Laterality: Bilateral

## 2012-03-21 SURGERY — Surgical Case
Anesthesia: Regional

## 2012-04-18 ENCOUNTER — Other Ambulatory Visit: Payer: Medicaid Other | Admitting: Lab

## 2012-04-18 ENCOUNTER — Ambulatory Visit: Payer: Medicaid Other | Admitting: Medical

## 2012-04-18 ENCOUNTER — Telehealth: Payer: Self-pay | Admitting: Hematology & Oncology

## 2012-04-18 NOTE — Telephone Encounter (Signed)
Patient called and cx 04/18/12 appt and resch for 04/29/12

## 2012-04-28 ENCOUNTER — Other Ambulatory Visit: Payer: Self-pay | Admitting: Obstetrics & Gynecology

## 2012-04-28 ENCOUNTER — Other Ambulatory Visit (HOSPITAL_COMMUNITY)
Admission: RE | Admit: 2012-04-28 | Discharge: 2012-04-28 | Disposition: A | Discharge status: Home / Self Care | Payer: Medicaid Other | Source: Ambulatory Visit | Attending: Obstetrics & Gynecology | Admitting: Obstetrics & Gynecology

## 2012-04-28 DIAGNOSIS — Z1151 Encounter for screening for human papillomavirus (HPV): Secondary | ICD-10-CM | POA: Insufficient documentation

## 2012-04-28 DIAGNOSIS — Z124 Encounter for screening for malignant neoplasm of cervix: Secondary | ICD-10-CM | POA: Insufficient documentation

## 2012-04-29 ENCOUNTER — Other Ambulatory Visit: Payer: Medicaid Other | Admitting: Lab

## 2012-04-29 ENCOUNTER — Ambulatory Visit: Payer: Medicaid Other | Admitting: Medical

## 2012-05-02 ENCOUNTER — Other Ambulatory Visit: Payer: Medicaid Other | Admitting: Lab

## 2012-05-02 ENCOUNTER — Ambulatory Visit: Payer: Medicaid Other | Admitting: Medical

## 2012-05-05 ENCOUNTER — Other Ambulatory Visit (HOSPITAL_BASED_OUTPATIENT_CLINIC_OR_DEPARTMENT_OTHER): Payer: Medicaid Other | Admitting: Lab

## 2012-05-05 ENCOUNTER — Ambulatory Visit (HOSPITAL_BASED_OUTPATIENT_CLINIC_OR_DEPARTMENT_OTHER): Payer: Medicaid Other | Admitting: Medical

## 2012-05-05 VITALS — BP 131/82 | HR 78 | Temp 97.8°F | Resp 20 | Ht 61.0 in | Wt 185.0 lb

## 2012-05-05 DIAGNOSIS — D509 Iron deficiency anemia, unspecified: Secondary | ICD-10-CM

## 2012-05-05 DIAGNOSIS — E611 Iron deficiency: Secondary | ICD-10-CM

## 2012-05-05 DIAGNOSIS — Z9884 Bariatric surgery status: Secondary | ICD-10-CM

## 2012-05-05 LAB — CHCC SATELLITE - SMEAR

## 2012-05-05 LAB — CBC WITH DIFFERENTIAL (CANCER CENTER ONLY)
BASO%: 0.6 % (ref 0.0–2.0)
LYMPH#: 3.4 10*3/uL — ABNORMAL HIGH (ref 0.9–3.3)
MONO#: 0.4 10*3/uL (ref 0.1–0.9)
NEUT#: 2.5 10*3/uL (ref 1.5–6.5)
Platelets: 285 10*3/uL (ref 145–400)
RDW: 21.7 % — ABNORMAL HIGH (ref 11.1–15.7)
WBC: 6.4 10*3/uL (ref 3.9–10.0)

## 2012-05-05 LAB — IRON AND TIBC
TIBC: 352 ug/dL (ref 250–470)
UIBC: 313 ug/dL (ref 125–400)

## 2012-05-05 NOTE — Progress Notes (Signed)
Diagnosis: #1.  Iron deficiency anemia, secondary to gastric bypass. #2  status post cesarean section x3 .  On 03/16/2012  Current therapy: IV iron with Feraheme as needed.  Last dose of IV iron was 02/15/2012 and 02/22/2012 for a total of 1,020 mg.  Interim history: Ms. Manring comes in today for an office followup visit.  Overall, she reports, she is doing quite well.  She recently delivered baby  #4 on 03/16/2012 by cesarean section. She has five children all together as one is her stepchild.  She reports, she did have some issues with her incision, not closing properly, however, that is now resolved.  She also had a skin abscess that came up and received antibiotics for that and that is resolved as well..  She did have to have IV iron infusion.  Back on July 8 and 15th.  Her last iron panel was on 03/07/2012.  Iron was 112 with 28% saturation and a ferritin was 135.  Her labs look great today.  Hemoglobin is 12.4, hematocrit 36.5, MCV is still relatively low at 78. Overall, she reports, that she feels, well.  She's not having any complications.  She continues to followup with Dr. Christell Constant.  She denies any nausea, vomiting, diarrhea, constipation, cough, chest pain, shortness of breath.  She denies any obvious, bleeding.  She denies craving any ice.  She denies any syncope or dizziness.  She denies any headaches, visual changes, or rashes.    Review of Systems:Significan for lower extremity edema, otherwise: Pt. Denies any changes in their vision, hearing, adenopathy, fevers, chills, nausea, vomiting, diarrhea, constipation, chest pain, shortness of breath, passing blood, passing out, blacking out,  any changes in skin, joints, neurologic or psychiatric except as noted.  Physical Exam: This is a pleasant, 31 year old, pregnant, African American, female, in no obvious distress Vitals: Temperature 97.8 degrees, pulse 78, respirations 20, blood pressure 131/82.  Weight 185 pounds HEENT reveals a  normocephalic, atraumatic skull, no scleral icterus, no oral lesions  Neck is supple without any cervical or supraclavicular adenopathy.  Lungs are clear to auscultation bilaterally. There are no wheezes, rales or rhonci Cardiac is regular rate and rhythm with a normal S1 and S2. There are no murmurs, rubs, or bruits.  Abdomen-pregnant abdomen which  is soft with good bowel sounds there's a palpable mass. There is no palpable hepatosplenomegaly. There is no palpable fluid wave.  She does have a well healed cesarean scar. Musculoskeletal no tenderness of the spine, ribs, or hips.  Extremities there are no clubbing, cyanosis or edema Skin no petechia, purpura or ecchymosis Neurologic is nonfocal.  Laboratory Data: White count 6.4, hemoglobin 12.4, hematocrit 36.5, MCV 78, platelets 285,000  Current Outpatient Prescriptions on File Prior to Visit  Medication Sig Dispense Refill  . HYDROmorphone (DILAUDID) 2 MG tablet 1-2 tablets every 4-6 hours as needed for pain.  30 tablet  0  . Prenatal Vit-Fe Fumarate-FA (PRENATAL MULTIVITAMIN) TABS Take 1 tablet by mouth every morning.      . vitamin B-12 (CYANOCOBALAMIN) 100 MCG tablet Take 50 mcg by mouth daily.       Assessment/Plan: This is a pleasant, pregnant 31 year old, African American, female, with the following issues:  #1.  History of iron deficiency anemia, secondary to gastric bypass-.  She did have to have an IV iron infusion back on July 8 and 15th.  She did not require any type of IV iron in the hospital before or after her cesarean section.  We will await  the results of her iron panel, and if need be, bring Ms. Lachapelle in for IV.  #2 followup we will follow back up with Ms. Giebler in 3 months but before then should there be any questions or concerns.

## 2012-08-01 ENCOUNTER — Telehealth: Payer: Self-pay | Admitting: Hematology & Oncology

## 2012-08-01 ENCOUNTER — Ambulatory Visit: Payer: Medicaid Other | Admitting: Hematology & Oncology

## 2012-08-01 ENCOUNTER — Ambulatory Visit (HOSPITAL_BASED_OUTPATIENT_CLINIC_OR_DEPARTMENT_OTHER): Payer: Self-pay | Admitting: Medical

## 2012-08-01 ENCOUNTER — Other Ambulatory Visit (HOSPITAL_BASED_OUTPATIENT_CLINIC_OR_DEPARTMENT_OTHER): Payer: Self-pay | Admitting: Lab

## 2012-08-01 ENCOUNTER — Other Ambulatory Visit: Payer: Medicaid Other | Admitting: Lab

## 2012-08-01 VITALS — BP 146/62 | HR 104 | Temp 98.2°F | Resp 16 | Ht 61.0 in | Wt 198.0 lb

## 2012-08-01 DIAGNOSIS — D509 Iron deficiency anemia, unspecified: Secondary | ICD-10-CM

## 2012-08-01 DIAGNOSIS — E611 Iron deficiency: Secondary | ICD-10-CM

## 2012-08-01 DIAGNOSIS — Z9884 Bariatric surgery status: Secondary | ICD-10-CM

## 2012-08-01 LAB — IRON AND TIBC: Iron: 30 ug/dL — ABNORMAL LOW (ref 42–145)

## 2012-08-01 LAB — CBC WITH DIFFERENTIAL (CANCER CENTER ONLY)
BASO#: 0 10*3/uL (ref 0.0–0.2)
BASO%: 0.5 % (ref 0.0–2.0)
EOS%: 0.8 % (ref 0.0–7.0)
HGB: 11.8 g/dL (ref 11.6–15.9)
LYMPH#: 3.1 10*3/uL (ref 0.9–3.3)
MCHC: 32.6 g/dL (ref 32.0–36.0)
NEUT#: 3.6 10*3/uL (ref 1.5–6.5)
Platelets: 235 10*3/uL (ref 145–400)
RDW: 15.3 % (ref 11.1–15.7)

## 2012-08-01 LAB — CHCC SATELLITE - SMEAR

## 2012-08-01 LAB — FERRITIN: Ferritin: 2 ng/mL — ABNORMAL LOW (ref 10–291)

## 2012-08-01 LAB — RETICULOCYTES (CHCC): ABS Retic: 46.1 10*3/uL (ref 19.0–186.0)

## 2012-08-01 NOTE — Progress Notes (Signed)
Diagnosis: #1.  Iron deficiency anemia, secondary to gastric bypass. #2  status post cesarean section x3 .  On 03/16/2012  Current therapy: IV iron with Feraheme as needed.  Last dose of IV iron was 02/15/2012 and 02/22/2012 for a total of 1,020 mg.  Interim history: Ms. Donalson comes in today for an office followup visit.  Overall, she reports, she is doing quite well.  She recently delivered baby  #4 on 03/16/2012 by cesarean section. She has five children all together as one is her stepchild.  She reports, that she has had an increase in fatigue.  She is starting to have some weird cravings.  The last, time, she received IV iron was back on July 8 and July 15.  Her last iron panel back in September,revealed a ferritin of 20 an iron of 39, and 11% saturation.  I'm not sure why she was unable to come in her IV iron.  At that time. Her labs  Today reveal a  Hemoglobin is 11.8, hematocrit 36.2, MCV is still relatively low at 79.  Her counts are dropping a little, we can go ahead and get her set up for IV iron this week. She denies any nausea, vomiting, diarrhea, constipation, cough, chest pain, shortness of breath.  She denies any obvious, bleeding. She denies any syncope or dizziness.  She denies any headaches, visual changes, or rashes.    Review of Systems: Constitutional:Negative for malaise/fatigue, fever, chills, weight loss, diaphoresis, activity change, appetite change, and unexpected weight change.  HEENT: Negative for double vision, blurred vision, visual loss, ear pain, tinnitus, congestion, rhinorrhea, epistaxis sore throat or sinus disease, oral pain/lesion, tongue soreness Respiratory: Negative for cough, chest tightness, shortness of breath, wheezing and stridor.  Cardiovascular: Negative for chest pain, palpitations, leg swelling, orthopnea, PND, DOE or claudication Gastrointestinal: Negative for nausea, vomiting, abdominal pain, diarrhea, constipation, blood in stool, melena,  hematochezia, abdominal distention, anal bleeding, rectal pain, anorexia and hematemesis.  Genitourinary: Negative for dysuria, frequency, hematuria,  Musculoskeletal: Negative for myalgias, back pain, joint swelling, arthralgias and gait problem.  Skin: Negative for rash, color change, pallor and wound.  Neurological:. Negative for dizziness/light-headedness, tremors, seizures, syncope, facial asymmetry, speech difficulty, weakness, numbness, headaches and paresthesias.  Hematological: Negative for adenopathy. Does not bruise/bleed easily.  Psychiatric/Behavioral:  Negative for depression, no loss of interest in normal activity or change in sleep pattern.   Physical Exam: This is a pleasant, 31 year old, pregnant, African American, female, in no obvious distress Vitals: Temperature 90.2 degrees, pulse 24, respirations 16, blood pressure 146/62.  Weight 198 pounds HEENT reveals a normocephalic, atraumatic skull, no scleral icterus, no oral lesions  Neck is supple without any cervical or supraclavicular adenopathy.  Lungs are clear to auscultation bilaterally. There are no wheezes, rales or rhonci Cardiac is regular rate and rhythm with a normal S1 and S2. There are no murmurs, rubs, or bruits.  Abdomen-pregnant abdomen which  is soft with good bowel sounds there's a palpable mass. There is no palpable hepatosplenomegaly. There is no palpable fluid wave.  She does have a well healed cesarean scar. Musculoskeletal no tenderness of the spine, ribs, or hips.  Extremities there are no clubbing, cyanosis or edema Skin no petechia, purpura or ecchymosis Neurologic is nonfocal.  Laboratory Data: White count 7.3, hemoglobin 11.8, hematocrit 36.2, MCV 79, platelets 235,000  Current Outpatient Prescriptions on File Prior to Visit  Medication Sig Dispense Refill  . Cephalexin 250 MG tablet Take 250 mg by mouth 4 (four) times  daily.      Marland Kitchen HYDROmorphone (DILAUDID) 2 MG tablet 1-2 tablets every 4-6 hours  as needed for pain.  30 tablet  0  . Prenatal Vit-Fe Fumarate-FA (PRENATAL MULTIVITAMIN) TABS Take 1 tablet by mouth every morning.      . TraMADol HCl 50 MG TBDP Take 50 mg by mouth 4 (four) times daily.      . vitamin B-12 (CYANOCOBALAMIN) 100 MCG tablet Take 50 mcg by mouth daily.       Assessment/Plan: This is a pleasant, pregnant 31 year old, African American, female, with the following issues:  #1.  History of iron deficiency anemia, secondary to gastric bypass-.  She did have to have an IV iron infusion back on July 8 and 15th.  She did not require any type of IV iron in the hospital before or after her cesarean section.  Her counts are dropping, and her MCV is low.  I do suspect, that she will need IV iron.  We will go ahead and get that set up for tomorrow.  #2 followup we will follow back up with Ms. Bellucci in 3 months but before then should there be any questions or concerns.

## 2012-08-01 NOTE — Telephone Encounter (Signed)
Pt aware she will be responisble for iron infusion cost. She is waiting to talk to [facility]

## 2012-08-02 ENCOUNTER — Ambulatory Visit (HOSPITAL_BASED_OUTPATIENT_CLINIC_OR_DEPARTMENT_OTHER): Payer: Self-pay

## 2012-08-02 VITALS — BP 141/99 | HR 71 | Temp 97.6°F | Resp 18

## 2012-08-02 DIAGNOSIS — D509 Iron deficiency anemia, unspecified: Secondary | ICD-10-CM

## 2012-08-02 DIAGNOSIS — E611 Iron deficiency: Secondary | ICD-10-CM

## 2012-08-02 MED ORDER — SODIUM CHLORIDE 0.9 % IV SOLN
Freq: Once | INTRAVENOUS | Status: AC
  Administered 2012-08-02: 11:00:00 via INTRAVENOUS

## 2012-08-02 MED ORDER — FERUMOXYTOL INJECTION 510 MG/17 ML
1020.0000 mg | Freq: Once | INTRAVENOUS | Status: AC
  Administered 2012-08-02: 1020 mg via INTRAVENOUS
  Filled 2012-08-02: qty 34

## 2012-08-02 NOTE — Patient Instructions (Signed)
Ferumoxytol injection What is this medicine? FERUMOXYTOL is an iron complex. Iron is used to make healthy red blood cells, which carry oxygen and nutrients throughout the body. This medicine is used to treat iron deficiency anemia in people with chronic kidney disease. This medicine may be used for other purposes; ask your health care provider or pharmacist if you have questions. What should I tell my health care provider before I take this medicine? They need to know if you have any of these conditions: -anemia not caused by low iron levels -high levels of iron in the blood -magnetic resonance imaging (MRI) test scheduled -an unusual or allergic reaction to iron, other medicines, foods, dyes, or preservatives -pregnant or trying to get pregnant -breast-feeding How should I use this medicine? This medicine is for infusion into a vein. It is given by a health care professional in a hospital or clinic setting. Talk to your pediatrician regarding the use of this medicine in children. Special care may be needed. Overdosage: If you think you've taken too much of this medicine contact a poison control center or emergency room at once. Overdosage: If you think you have taken too much of this medicine contact a poison control center or emergency room at once. NOTE: This medicine is only for you. Do not share this medicine with others. What if I miss a dose? It is important not to miss your dose. Call your doctor or health care professional if you are unable to keep an appointment. What may interact with this medicine? This medicine may interact with the following medications: -other iron products This list may not describe all possible interactions. Give your health care provider a list of all the medicines, herbs, non-prescription drugs, or dietary supplements you use. Also tell them if you smoke, drink alcohol, or use illegal drugs. Some items may interact with your medicine. What should I watch  for while using this medicine? Visit your doctor or healthcare professional regularly. Tell your doctor or healthcare professional if your symptoms do not start to get better or if they get worse. You may need blood work done while you are taking this medicine. You may need to follow a special diet. Talk to your doctor. Foods that contain iron include: whole grains/cereals, dried fruits, beans, or peas, leafy green vegetables, and organ meats (liver, kidney). What side effects may I notice from receiving this medicine? Side effects that you should report to your doctor or health care professional as soon as possible: -allergic reactions like skin rash, itching or hives, swelling of the face, lips, or tongue -breathing problems -changes in blood pressure -feeling faint or lightheaded, falls -fever or chills -flushing, sweating, or hot feelings -swelling of the ankles or feet Side effects that usually do not require medical attention (Report these to your doctor or health care professional if they continue or are bothersome.): -diarrhea -headache -nausea, vomiting -stomach pain This list may not describe all possible side effects. Call your doctor for medical advice about side effects. You may report side effects to FDA at 1-800-FDA-1088. Where should I keep my medicine? This drug is given in a hospital or clinic and will not be stored at home. NOTE: This sheet is a summary. It may not cover all possible information. If you have questions about this medicine, talk to your doctor, pharmacist, or health care provider.  2012, [employer]vier/Gold Standard. (04/18/2008 9:48:25 PM) 

## 2012-11-02 ENCOUNTER — Telehealth: Payer: Self-pay | Admitting: Hematology & Oncology

## 2012-11-02 NOTE — Telephone Encounter (Signed)
Pt cx 3-27 said would call back to reschedule

## 2012-11-03 ENCOUNTER — Ambulatory Visit: Payer: Self-pay | Admitting: Medical

## 2012-11-03 ENCOUNTER — Other Ambulatory Visit: Payer: Self-pay | Admitting: Lab

## 2012-11-27 DIAGNOSIS — Z9884 Bariatric surgery status: Secondary | ICD-10-CM | POA: Insufficient documentation

## 2013-06-13 ENCOUNTER — Other Ambulatory Visit: Payer: Self-pay | Admitting: *Deleted

## 2013-06-13 DIAGNOSIS — E611 Iron deficiency: Secondary | ICD-10-CM

## 2013-06-13 DIAGNOSIS — Z9884 Bariatric surgery status: Secondary | ICD-10-CM

## 2013-06-13 NOTE — Progress Notes (Signed)
Pt called stating she now has insurance and is needing to come in to have her iron levels checked. She is having a strong craving for ice. To have a lab only visit to confirm she needs iron and a f/u visit with Dr Myna Hidalgo in approx 3 months. She understands that Dr Myna Hidalgo will let her know if her levels are low enough to need iron. Request sent to scheduling to set up her appts. The best # to reach her is [phone].

## 2013-06-14 ENCOUNTER — Telehealth: Payer: Self-pay | Admitting: Hematology & Oncology

## 2013-06-14 NOTE — Telephone Encounter (Signed)
Pt aware of 11-10 and 2-5 appointments

## 2013-06-15 DIAGNOSIS — IMO0001 Reserved for inherently not codable concepts without codable children: Secondary | ICD-10-CM | POA: Insufficient documentation

## 2013-06-15 DIAGNOSIS — G43909 Migraine, unspecified, not intractable, without status migrainosus: Secondary | ICD-10-CM | POA: Insufficient documentation

## 2013-06-15 DIAGNOSIS — D509 Iron deficiency anemia, unspecified: Secondary | ICD-10-CM | POA: Insufficient documentation

## 2013-06-15 DIAGNOSIS — N946 Dysmenorrhea, unspecified: Secondary | ICD-10-CM | POA: Insufficient documentation

## 2013-06-15 DIAGNOSIS — F419 Anxiety disorder, unspecified: Secondary | ICD-10-CM | POA: Insufficient documentation

## 2013-06-19 ENCOUNTER — Other Ambulatory Visit (HOSPITAL_BASED_OUTPATIENT_CLINIC_OR_DEPARTMENT_OTHER): Payer: Medicaid Other | Admitting: Lab

## 2013-06-19 DIAGNOSIS — Z9884 Bariatric surgery status: Secondary | ICD-10-CM

## 2013-06-19 DIAGNOSIS — E611 Iron deficiency: Secondary | ICD-10-CM

## 2013-06-19 DIAGNOSIS — D509 Iron deficiency anemia, unspecified: Secondary | ICD-10-CM

## 2013-06-19 LAB — CHCC SATELLITE - SMEAR

## 2013-06-19 LAB — CBC WITH DIFFERENTIAL (CANCER CENTER ONLY)
Eosinophils Absolute: 0.1 10*3/uL (ref 0.0–0.5)
HCT: 32.2 % — ABNORMAL LOW (ref 34.8–46.6)
MCH: 22.6 pg — ABNORMAL LOW (ref 26.0–34.0)
MONO#: 0.7 10*3/uL (ref 0.1–0.9)
MONO%: 8.8 % (ref 0.0–13.0)
NEUT#: 4.4 10*3/uL (ref 1.5–6.5)
Platelets: 223 10*3/uL (ref 145–400)
RBC: 4.42 10*6/uL (ref 3.70–5.32)
RDW: 16.6 % — ABNORMAL HIGH (ref 11.1–15.7)
WBC: 8.2 10*3/uL (ref 3.9–10.0)

## 2013-06-20 ENCOUNTER — Ambulatory Visit: Payer: Self-pay | Admitting: Internal Medicine

## 2013-06-20 LAB — IRON AND TIBC CHCC: UIBC: 428 ug/dL — ABNORMAL HIGH (ref 120–384)

## 2013-06-20 LAB — FERRITIN CHCC: Ferritin: 12 ng/ml (ref 9–269)

## 2013-06-21 ENCOUNTER — Telehealth: Payer: Self-pay | Admitting: Nurse Practitioner

## 2013-06-21 NOTE — Telephone Encounter (Addendum)
Message copied by Glee Arvin on Wed Jun 21, 2013 11:03 AM ------      Message from: Arlan Organ R      Created: Tue Jun 20, 2013  9:08 PM       Call - iron still low!! Need to give Feraheme 1020mg  x 1 dose. Please set up!!  Pete ------LVM informing pt of the need to schedule an appointment. Informed her to call and ask Raiford Noble to please schedule her for an Iron appointment and if she had any questions or concerns to please do not hesitate to contact one of the nurses.

## 2013-06-22 ENCOUNTER — Telehealth: Payer: Self-pay | Admitting: Hematology & Oncology

## 2013-06-22 NOTE — Telephone Encounter (Signed)
Patient called and sch iron apt for 06/27/13

## 2013-06-27 ENCOUNTER — Ambulatory Visit (HOSPITAL_BASED_OUTPATIENT_CLINIC_OR_DEPARTMENT_OTHER): Payer: Medicaid Other

## 2013-06-27 VITALS — BP 124/81 | Temp 98.0°F | Resp 18

## 2013-06-27 DIAGNOSIS — D508 Other iron deficiency anemias: Secondary | ICD-10-CM

## 2013-06-27 DIAGNOSIS — K912 Postsurgical malabsorption, not elsewhere classified: Secondary | ICD-10-CM

## 2013-06-27 DIAGNOSIS — E611 Iron deficiency: Secondary | ICD-10-CM

## 2013-06-27 MED ORDER — SODIUM CHLORIDE 0.9 % IV SOLN
1020.0000 mg | Freq: Once | INTRAVENOUS | Status: AC
  Administered 2013-06-27: 1020 mg via INTRAVENOUS
  Filled 2013-06-27: qty 34

## 2013-06-27 NOTE — Patient Instructions (Signed)

## 2013-06-30 ENCOUNTER — Encounter: Payer: Self-pay | Admitting: Nurse Practitioner

## 2013-06-30 NOTE — Progress Notes (Signed)
Pt stated "she knows she just got iron on Wednesday, however her cycle came on today and she is extremely tired". Informed pt that since it was the first time in a while since she received iron it will take some time to feel the benefits of the medication. She verbalized understanding and stated it does usually take a week or me in the past. Instructed her to take frequent rest breaks, nap, drink lots of fluids and if she develops a headache as she states she has in the past to take a Tylenol or ibuprofen as she usually does. She stated she does take migraine medication and would use that if one occurred. Offered support and told her to contact the office with any further difficulties. She verbalized understanding and appreciation.

## 2013-09-13 ENCOUNTER — Other Ambulatory Visit: Payer: Self-pay | Admitting: Nurse Practitioner

## 2013-09-13 DIAGNOSIS — E611 Iron deficiency: Secondary | ICD-10-CM

## 2013-09-13 DIAGNOSIS — Z9884 Bariatric surgery status: Secondary | ICD-10-CM

## 2013-09-14 ENCOUNTER — Other Ambulatory Visit (HOSPITAL_BASED_OUTPATIENT_CLINIC_OR_DEPARTMENT_OTHER): Payer: Medicaid Other | Admitting: Lab

## 2013-09-14 ENCOUNTER — Encounter: Payer: Self-pay | Admitting: Hematology & Oncology

## 2013-09-14 ENCOUNTER — Ambulatory Visit (HOSPITAL_BASED_OUTPATIENT_CLINIC_OR_DEPARTMENT_OTHER): Payer: Medicaid Other | Admitting: Hematology & Oncology

## 2013-09-14 VITALS — BP 122/78 | HR 86 | Temp 99.2°F | Resp 14 | Ht 64.0 in | Wt 211.0 lb

## 2013-09-14 DIAGNOSIS — N920 Excessive and frequent menstruation with regular cycle: Secondary | ICD-10-CM

## 2013-09-14 DIAGNOSIS — D510 Vitamin B12 deficiency anemia due to intrinsic factor deficiency: Secondary | ICD-10-CM

## 2013-09-14 DIAGNOSIS — Z9884 Bariatric surgery status: Secondary | ICD-10-CM

## 2013-09-14 DIAGNOSIS — E611 Iron deficiency: Secondary | ICD-10-CM

## 2013-09-14 DIAGNOSIS — N921 Excessive and frequent menstruation with irregular cycle: Secondary | ICD-10-CM

## 2013-09-14 DIAGNOSIS — D509 Iron deficiency anemia, unspecified: Secondary | ICD-10-CM

## 2013-09-14 LAB — CBC WITH DIFFERENTIAL (CANCER CENTER ONLY)
BASO#: 0 10*3/uL (ref 0.0–0.2)
BASO%: 0.4 % (ref 0.0–2.0)
EOS%: 3.2 % (ref 0.0–7.0)
Eosinophils Absolute: 0.3 10*3/uL (ref 0.0–0.5)
HCT: 40.8 % (ref 34.8–46.6)
HGB: 13.4 g/dL (ref 11.6–15.9)
LYMPH#: 4.1 10*3/uL — AB (ref 0.9–3.3)
LYMPH%: 49.8 % — ABNORMAL HIGH (ref 14.0–48.0)
MCH: 26.3 pg (ref 26.0–34.0)
MCHC: 32.8 g/dL (ref 32.0–36.0)
MCV: 80 fL — ABNORMAL LOW (ref 81–101)
MONO#: 0.6 10*3/uL (ref 0.1–0.9)
MONO%: 7.2 % (ref 0.0–13.0)
NEUT#: 3.2 10*3/uL (ref 1.5–6.5)
NEUT%: 39.4 % — ABNORMAL LOW (ref 39.6–80.0)
PLATELETS: 308 10*3/uL (ref 145–400)
RBC: 5.09 10*6/uL (ref 3.70–5.32)
RDW: 22 % — AB (ref 11.1–15.7)
WBC: 8.2 10*3/uL (ref 3.9–10.0)

## 2013-09-14 LAB — RETICULOCYTES (CHCC)
ABS Retic: 56.2 10*3/uL (ref 19.0–186.0)
RBC.: 5.11 MIL/uL (ref 3.87–5.11)
RETIC CT PCT: 1.1 % (ref 0.4–2.3)

## 2013-09-14 LAB — CHCC SATELLITE - SMEAR

## 2013-09-14 LAB — VITAMIN B12: VITAMIN B 12: 671 pg/mL (ref 211–911)

## 2013-09-14 LAB — IRON AND TIBC CHCC
%SAT: 21 % (ref 21–57)
Iron: 73 ug/dL (ref 41–142)
TIBC: 345 ug/dL (ref 236–444)
UIBC: 272 ug/dL (ref 120–384)

## 2013-09-14 NOTE — Progress Notes (Signed)
This office note has been dictated.

## 2013-09-14 NOTE — Addendum Note (Signed)
Addended by: Arlan OrganENNEVER, Teairra Millar R on: 09/14/2013 10:18 AM   Modules accepted: Orders

## 2013-09-16 NOTE — Progress Notes (Signed)
CC:   Everrett Coombeody Matthews, MD  DIAGNOSES: 1. Iron-deficiency anemia. 2. Gastric bypass. 3  Third trimester. 1. The patient status post pregnancy with normal delivery.  CURRENT THERAPY:  The patient did receive IV iron back in November, I think by family practice.  INTERIM HISTORY:  Ms. Jennifer Brown comes in for a followup.  We last saw her back in June of 2013.  At that point in time, she had iron deficiency. She was doing well.  She went and gave birth in August to a healthy baby boy.  We have not seen her since.  Back in November of 2014, her ferritin was 12 with an iron saturation of 3%.  She got IV iron.  It was not done by us.  I think it was done by her family doctor.  She now comes back to visit us.  She feels well.  She is not choosing us.  She is still having a lot of heavy cycles.  She must have a hysterectomy.  She goes back to gynecologist next week.  She has had no fever.  She has had no rashes.  She has had no cough or shortness of breath.  PHYSICAL EXAMINATION:  General:  This is a well-developed, well- nourished African American female in no obvious distress.  Vital Signs: Temperature of 99.2, pulse 86, respiratory rate 16, blood pressure 122/78.  Weight is 211 pounds.  Head and Neck:  Normocephalic, atraumatic skull.  There are no ocular or oral lesions.  There are no palpable cervical or supraclavicular lymph nodes.  Lungs:  Clear bilaterally.  Cardiac:  Regular rate and rhythm with a normal S1, S2. There are no murmurs, rubs, or bruits.  Abdomen:  Soft.  She has good bowel sounds.  There is no fluid wave.  There is no palpable abdominal mass.  There is no palpable hepatosplenomegaly.  Back:  No tenderness over the spine, ribs, or hips.  Extremities:  Shows no clubbing, cyanosis, or edema.  Neurological:  Shows no focal neurological deficits.  LABORATORY STUDIES:  White cell count 8.2, hemoglobin 13.4, hematocrit 40.8, platelet count 308.  MCV is 80.  Peripheral  smear does show some mild anisocytosis and poikilocytosis. She has some microcytic red cells.  She has no nucleated red blood cells.  I see no rouleaux formation.  White cells appear normal in morphology maturation.  Platelets are adequate in number and size.  IMPRESSION:  Ms. Jennifer Brown is a 33 year old African American female.  She has iron-deficiency anemia.  She has menometrorrhagia.  She has a history of gastric bypass.  Of note, we have checked her hemoglobin electrophoresis in the past. She has a normal hemoglobin pattern.  We will see what her iron studies show.  I do not think B12 deficiency is a problem for her right now.  We will probably will have to check is when we see her back.  We will go ahead and plan to get her back to see us in about 2 or 3 months.  I think this would be a reasonable amount of time for followup.    ______________________________ Josph MachoPeter R Nakya Weyand, M.D. PRE/MEDQ  D:  09/14/2013  T:  09/15/2013  Job:  [id number]

## 2013-12-05 ENCOUNTER — Telehealth: Payer: Self-pay | Admitting: Hematology & Oncology

## 2013-12-05 NOTE — Telephone Encounter (Signed)
Maxine GlennRn Nikki spoke with patient and patient cx 12/14/13 iron apt and resch for 12/07/13

## 2013-12-07 ENCOUNTER — Ambulatory Visit (HOSPITAL_BASED_OUTPATIENT_CLINIC_OR_DEPARTMENT_OTHER): Payer: Medicaid Other

## 2013-12-07 VITALS — BP 122/75 | HR 81

## 2013-12-07 DIAGNOSIS — D509 Iron deficiency anemia, unspecified: Secondary | ICD-10-CM

## 2013-12-07 DIAGNOSIS — N920 Excessive and frequent menstruation with regular cycle: Secondary | ICD-10-CM

## 2013-12-07 DIAGNOSIS — Z9884 Bariatric surgery status: Secondary | ICD-10-CM

## 2013-12-07 DIAGNOSIS — E611 Iron deficiency: Secondary | ICD-10-CM

## 2013-12-07 MED ORDER — SODIUM CHLORIDE 0.9 % IV SOLN
1020.0000 mg | Freq: Once | INTRAVENOUS | Status: AC
  Administered 2013-12-07: 1020 mg via INTRAVENOUS
  Filled 2013-12-07: qty 34

## 2013-12-07 MED ORDER — SODIUM CHLORIDE 0.9 % IV SOLN
INTRAVENOUS | Status: DC
  Administered 2013-12-07: 13:00:00 via INTRAVENOUS

## 2013-12-07 NOTE — Patient Instructions (Signed)

## 2013-12-14 ENCOUNTER — Ambulatory Visit: Payer: Medicaid Other

## 2013-12-14 ENCOUNTER — Other Ambulatory Visit: Payer: Medicaid Other | Admitting: Lab

## 2013-12-14 ENCOUNTER — Ambulatory Visit: Payer: Medicaid Other | Admitting: Hematology & Oncology

## 2013-12-20 ENCOUNTER — Telehealth: Payer: Self-pay | Admitting: Hematology & Oncology

## 2013-12-20 NOTE — Telephone Encounter (Signed)
Pt aware of 6-17 appointment could only come in after 1030 am

## 2014-01-11 DIAGNOSIS — G8929 Other chronic pain: Secondary | ICD-10-CM | POA: Insufficient documentation

## 2014-01-11 DIAGNOSIS — M545 Low back pain, unspecified: Secondary | ICD-10-CM | POA: Insufficient documentation

## 2014-01-23 ENCOUNTER — Telehealth: Payer: Self-pay | Admitting: Hematology & Oncology

## 2014-01-23 NOTE — Telephone Encounter (Signed)
Patient called and cx 01/24/14 apt and stated she will call back to resch

## 2014-01-24 ENCOUNTER — Other Ambulatory Visit: Payer: Medicaid Other | Admitting: Lab

## 2014-01-24 ENCOUNTER — Ambulatory Visit: Payer: Medicaid Other | Admitting: Hematology & Oncology

## 2014-05-01 DIAGNOSIS — R1013 Epigastric pain: Secondary | ICD-10-CM | POA: Insufficient documentation

## 2014-06-08 ENCOUNTER — Emergency Department (HOSPITAL_COMMUNITY)
Admission: EM | Admit: 2014-06-08 | Discharge: 2014-06-08 | Disposition: A | Discharge status: Home / Self Care | Payer: Self-pay | Attending: Emergency Medicine | Admitting: Emergency Medicine

## 2014-06-08 ENCOUNTER — Ambulatory Visit (HOSPITAL_COMMUNITY)
Admission: RE | Admit: 2014-06-08 | Discharge: 2014-06-08 | Disposition: A | Discharge status: Home / Self Care | Payer: Self-pay | Attending: Psychiatry | Admitting: Psychiatry

## 2014-06-08 ENCOUNTER — Encounter (HOSPITAL_COMMUNITY): Payer: Self-pay | Admitting: Emergency Medicine

## 2014-06-08 DIAGNOSIS — Z87440 Personal history of urinary (tract) infections: Secondary | ICD-10-CM | POA: Insufficient documentation

## 2014-06-08 DIAGNOSIS — T424X4A Poisoning by benzodiazepines, undetermined, initial encounter: Secondary | ICD-10-CM | POA: Insufficient documentation

## 2014-06-08 DIAGNOSIS — Z79899 Other long term (current) drug therapy: Secondary | ICD-10-CM | POA: Insufficient documentation

## 2014-06-08 DIAGNOSIS — F329 Major depressive disorder, single episode, unspecified: Secondary | ICD-10-CM | POA: Insufficient documentation

## 2014-06-08 DIAGNOSIS — Y9389 Activity, other specified: Secondary | ICD-10-CM | POA: Insufficient documentation

## 2014-06-08 DIAGNOSIS — Z8742 Personal history of other diseases of the female genital tract: Secondary | ICD-10-CM | POA: Insufficient documentation

## 2014-06-08 DIAGNOSIS — Y9289 Other specified places as the place of occurrence of the external cause: Secondary | ICD-10-CM | POA: Insufficient documentation

## 2014-06-08 DIAGNOSIS — D649 Anemia, unspecified: Secondary | ICD-10-CM | POA: Insufficient documentation

## 2014-06-08 DIAGNOSIS — Z87891 Personal history of nicotine dependence: Secondary | ICD-10-CM | POA: Insufficient documentation

## 2014-06-08 DIAGNOSIS — Z9884 Bariatric surgery status: Secondary | ICD-10-CM | POA: Insufficient documentation

## 2014-06-08 DIAGNOSIS — Z8669 Personal history of other diseases of the nervous system and sense organs: Secondary | ICD-10-CM | POA: Insufficient documentation

## 2014-06-08 LAB — CBC WITH DIFFERENTIAL/PLATELET
BASOS PCT: 0 % (ref 0–1)
Basophils Absolute: 0 10*3/uL (ref 0.0–0.1)
Eosinophils Absolute: 0.1 10*3/uL (ref 0.0–0.7)
Eosinophils Relative: 1 % (ref 0–5)
HEMATOCRIT: 34.2 % — AB (ref 36.0–46.0)
HEMOGLOBIN: 11.3 g/dL — AB (ref 12.0–15.0)
Lymphocytes Relative: 50 % — ABNORMAL HIGH (ref 12–46)
Lymphs Abs: 3.7 10*3/uL (ref 0.7–4.0)
MCH: 26.9 pg (ref 26.0–34.0)
MCHC: 33 g/dL (ref 30.0–36.0)
MCV: 81.4 fL (ref 78.0–100.0)
Monocytes Absolute: 0.6 10*3/uL (ref 0.1–1.0)
Monocytes Relative: 8 % (ref 3–12)
NEUTROS ABS: 3.1 10*3/uL (ref 1.7–7.7)
NEUTROS PCT: 41 % — AB (ref 43–77)
PLATELETS: 324 10*3/uL (ref 150–400)
RBC: 4.2 MIL/uL (ref 3.87–5.11)
RDW: 13.9 % (ref 11.5–15.5)
WBC: 7.5 10*3/uL (ref 4.0–10.5)

## 2014-06-08 LAB — COMPREHENSIVE METABOLIC PANEL
ALBUMIN: 3.2 g/dL — AB (ref 3.5–5.2)
ALK PHOS: 55 U/L (ref 39–117)
ALT: 21 U/L (ref 0–35)
AST: 24 U/L (ref 0–37)
Anion gap: 13 (ref 5–15)
BUN: 7 mg/dL (ref 6–23)
CO2: 24 mEq/L (ref 19–32)
CREATININE: 0.62 mg/dL (ref 0.50–1.10)
Calcium: 8.8 mg/dL (ref 8.4–10.5)
Chloride: 106 mEq/L (ref 96–112)
GFR calc Af Amer: >90 mL/min (ref >90)
GFR calc non Af Amer: >90 mL/min (ref >90)
Glucose, Bld: 83 mg/dL (ref 70–99)
POTASSIUM: 3.6 meq/L — AB (ref 3.7–5.3)
SODIUM: 143 meq/L (ref 137–147)
Total Bilirubin: 0.3 mg/dL (ref 0.3–1.2)
Total Protein: 6.7 g/dL (ref 6.0–8.3)

## 2014-06-08 LAB — SALICYLATE LEVEL: Salicylate Lvl: <2 mg/dL — ABNORMAL LOW (ref 2.8–20.0)

## 2014-06-08 LAB — ACETAMINOPHEN LEVEL

## 2014-06-08 LAB — ETHANOL: Alcohol, Ethyl (B): <11 mg/dL (ref 0–11)

## 2014-06-08 NOTE — BH Assessment (Addendum)
[facility]  Patient reported that she took over 30 pills.  Writer informed the Press photographerCharge Nurse Misty Stanley(Stacey) and the TTS Therapist Stevan Born(Tresa).  Due to the patient taking so many pills [facility]  The ambulance was called and [facility]. The patient is accompanied by her husband. Patient denies SI/HI/Psychosis. 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

## 2014-06-08 NOTE — Discharge Instructions (Signed)
Sedative Ingestion An overdose is when more drugs are taken than recommended. The risk of serious problems from overdosing on any sedative depends on the amount of drug taken and whether it is mixed with other drugs or alcohol. The most common group of sedatives are benzodiazepines, including:  Lorazepam.  Flurazepam.  Triazolam.  Chlordiazepoxide.  Oxazepam.  Diazepam.  Alprazolam. Sedatives may be prescribed for insomnia, anxiety, muscle tension, and alcohol or drug withdrawal symptoms. SYMPTOMS A sedative overdose causes symptoms similar to alcohol intoxication. These include:  Loss of coordination.  Slurred speech.  Slowed breathing.  Poor judgment.  Memory loss.  Drowsiness.  Blackouts.  Coma. Taking too many sedatives can cause:  Respiratory depression.  Vomiting.  Dehydration.  Low blood pressure.  Death. HOME CARE INSTRUCTIONS  At this point, hospital care is not needed.  You are at an increased risk for injury when on sedative drugs, especially when you drive or operate machinery. It is very important that someone watches you closely for the next 24-48 hours and calls for emergency help if you have trouble breathing or cannot be awakened from sleep.  You may have a hangover after sedative ingestion. Get plenty of rest and drink increased amounts of non-alcoholic fluids.  A sedative ingestion is often a sign of a severe emotional state or depression. If you have been taking a sedative medicine regularly for a long time and stop suddenly, you may have withdrawal symptoms, including anxiety, agitation, headache, and more serious symptoms. Be sure to see your doctor or counselor for further treatment to address these emotional and physical issues. SEEK IMMEDIATE MEDICAL CARE IF:   You develop recurrent dizziness or weakness or you faint.  You have trouble breathing.  You have a seizure. Document Released: 09/03/2004 Document Revised: 10/19/2011  Document Reviewed: 07/31/2009 [facility] Patient Information 2015 [employer] [place]. This information is not intended to replace advice given to you by your health care provider. Make sure you discuss any questions you have with your health care provider.

## 2014-06-08 NOTE — ED Notes (Signed)
Pt sts she took medications "to do something different, and have some fun". Pt denies SI, says "Oh I have a lot of great things coming". She appears drowsy.

## 2014-06-08 NOTE — ED Notes (Signed)
Per EMS pt from home with c/o drug overdose, pt reportedly took 20 tablets of alprazolam 0.5 mg and 20 tablets Fioricet (butalbital/acetaminophen/caffeine) in order to "mellow out and ease off stress". Pt denies SI. No distress.

## 2014-06-08 NOTE — ED Provider Notes (Signed)
CSN: [id number]     Arrival date & time 10/30/[id number]  1103 History   First MD Initiated Contact with Patient 06/08/14 1122     Chief Complaint  Patient presents with  . Drug Overdose     (Consider location/radiation/quality/duration/timing/severity/associated sxs/prior Treatment) HPI Comments: Patient is a 33 year old female who presents for evaluation of overdose of an unknown quantity of Xanax and Fioricet. According to the husband she picked up a prescription for each of these yesterday. At some point between last night and this evening, she has taken approximately 20 tablets of each. Her husband is concerned about her well-being and believes that she has an addiction to her prescription pills. She tells me that she took these medications to "chill out" and not to harm herself. They initially went to behavioral health, then was referred here for medical clearance.  Patient is a 33 y.o. female presenting with Overdose. The history is provided by the patient.  Drug Overdose This is a new problem. The current episode started 6 to 12 hours ago. The problem occurs constantly. The problem has not changed since onset.Pertinent negatives include no chest pain, no abdominal pain, no headaches and no shortness of breath. Nothing aggravates the symptoms. Nothing relieves the symptoms. She has tried nothing for the symptoms. The treatment provided no relief.    Past Medical History  Diagnosis Date  . Hx of gastric bypass 2004  . Morbid obesity   . Anemia   . Substance abuse     Tobacco, ETOH  . Urinary tract infection   . Ovarian cyst   . Insomnia   . Depression     no meds x 1 yr   Past Surgical History  Procedure Laterality Date  . Gastric bypass  01/23/03  . Breast surgery  12/25/08    Reduction  . Hernia repair  11/13/04  . Cholecystectomy  08/09/02  . Cesarean section  2001, 2005, 2009  . Induced abortion    . Breast reduction surgery    . Cesarean section  03/16/2012    Procedure:  CESAREAN SECTION;  Surgeon: Delbert Harnessaniel H Moore, MD;  Location: [facility];  Service: Gynecology;  Laterality: N/A;   Family History  Problem Relation Age of Onset  . Other Neg Hx    History  Substance Use Topics  . Smoking status: Former Smoker -- 0.25 packs/day for 10 years    Types: Cigarettes    Start date: 09/14/2002  . Smokeless tobacco: Never Used     Comment: very light smoker  . Alcohol Use: No   OB History   Grav Para Term Preterm Abortions TAB SAB Ect Mult Living   8 4 4  0 4 3 1  0 0 4     Review of Systems  Respiratory: Negative for shortness of breath.   Cardiovascular: Negative for chest pain.  Gastrointestinal: Negative for abdominal pain.  Neurological: Negative for headaches.  All other systems reviewed and are negative.     Allergies  Review of patient's allergies indicates no known allergies.  Home Medications   Prior to Admission medications   Medication Sig Start Date End Date Taking? Authorizing Provider  butalbital-acetaminophen-caffeine (FIORICET WITH CODEINE) 50-325-40-30 MG per capsule Take 2 capsules by mouth every 4 (four) hours as needed for headache or migraine.   Yes Historical Provider, MD  ALPRAZolam (XANAX) 0.25 MG tablet Take 1 mg by mouth 2 (two) times daily as needed for anxiety.    Historical Provider, MD  HYDROcodone-acetaminophen (NORCO) 10-325 MG  per tablet Take 1-2 tablets by mouth every 6 (six) hours as needed.    Historical Provider, MD  ibuprofen (ADVIL) 200 MG tablet Take 200 mg by mouth as needed. ADVIL PM FOR SLEEP    Historical Provider, MD  Iron-Vitamins (GERITOL PO) Take by mouth every morning. 3 Tbs. daily    Historical Provider, MD  sertraline (ZOLOFT) 25 MG tablet Take 25 mg by mouth daily.    Historical Provider, MD   BP 123/77  Pulse 75  Temp(Src) 98.6 F (37 C) (Oral)  Resp 16  SpO2 98% Physical Exam  Nursing note and vitals reviewed. Constitutional: She is oriented to person, place, and time. She appears  well-developed and well-nourished. No distress.  HENT:  Head: Normocephalic and atraumatic.  Neck: Normal range of motion. Neck supple.  Cardiovascular: Normal rate and regular rhythm.  Exam reveals no gallop and no friction rub.   No murmur heard. Pulmonary/Chest: Effort normal and breath sounds normal. No respiratory distress. She has no wheezes.  Abdominal: Soft. Bowel sounds are normal. She exhibits no distension. There is no tenderness.  Musculoskeletal: Normal range of motion.  Neurological: She is alert and oriented to person, place, and time.  Skin: Skin is warm and dry. She is not diaphoretic.    ED Course  Procedures (including critical care time) Labs Review Labs Reviewed  CBC WITH DIFFERENTIAL  COMPREHENSIVE METABOLIC PANEL  ACETAMINOPHEN LEVEL  SALICYLATE LEVEL  URINE RAPID DRUG SCREEN (HOSP PERFORMED)  URINALYSIS, ROUTINE W REFLEX MICROSCOPIC  PREGNANCY, URINE  ETHANOL    Imaging Review No results found.   EKG Interpretation   Date/Time:  Friday June 08 2014 11:21:13 EDT Ventricular Rate:  81 PR Interval:  144 QRS Duration: 84 QT Interval:  379 QTC Calculation: 440 R Axis:   35 Text Interpretation:  Sinus rhythm Normal ECG Confirmed by DELOS  MD,  Mariachristina Holle ([id number]) on 06/08/2014 4:06:38 PM      MDM   Final diagnoses:  None    Patient presents with complaints of taking more prescription medications than she should. She filled prescriptions for Xanax and Fioricet yesterday and multiple pills of these are missing. Her Tylenol level is undetectable. Her husband is concerned that she is addicted to these pills and wants her to have help. Her medical workup is unremarkable and I do not feel as though further workup is indicated. She informed me she took the Fioricet yesterday evening. Her Tylenol level is negative today and I do not feel as though intervention is indicated for this. She will be given outpatient information for substance abuse treatment and  understands to return if her symptoms worsen or change.    Geoffery Lyonsouglas Shaneequa Bahner, MD 06/08/14 [phone]

## 2014-06-08 NOTE — ED Notes (Signed)
Bed: OZ30WA15 Expected date:  Expected time:  Means of arrival:  Comments: EMS-OD-from [facility]

## 2014-06-11 ENCOUNTER — Encounter (HOSPITAL_COMMUNITY): Payer: Self-pay | Admitting: Emergency Medicine

## 2014-08-23 ENCOUNTER — Encounter (HOSPITAL_COMMUNITY): Payer: Self-pay | Admitting: Obstetrics & Gynecology

## 2014-09-30 DIAGNOSIS — N939 Abnormal uterine and vaginal bleeding, unspecified: Secondary | ICD-10-CM | POA: Insufficient documentation

## 2015-01-17 ENCOUNTER — Encounter (HOSPITAL_COMMUNITY): Payer: Self-pay | Admitting: Obstetrics & Gynecology

## 2015-03-25 ENCOUNTER — Encounter (HOSPITAL_BASED_OUTPATIENT_CLINIC_OR_DEPARTMENT_OTHER): Payer: Self-pay | Admitting: *Deleted

## 2015-03-25 ENCOUNTER — Emergency Department (HOSPITAL_BASED_OUTPATIENT_CLINIC_OR_DEPARTMENT_OTHER)
Admission: EM | Admit: 2015-03-25 | Discharge: 2015-03-25 | Disposition: A | Discharge status: Home / Self Care | Payer: Managed Care, Other (non HMO) | Attending: Emergency Medicine | Admitting: Emergency Medicine

## 2015-03-25 DIAGNOSIS — K0889 Other specified disorders of teeth and supporting structures: Secondary | ICD-10-CM

## 2015-03-25 DIAGNOSIS — Z8639 Personal history of other endocrine, nutritional and metabolic disease: Secondary | ICD-10-CM | POA: Diagnosis not present

## 2015-03-25 DIAGNOSIS — Z79899 Other long term (current) drug therapy: Secondary | ICD-10-CM | POA: Insufficient documentation

## 2015-03-25 DIAGNOSIS — F329 Major depressive disorder, single episode, unspecified: Secondary | ICD-10-CM | POA: Insufficient documentation

## 2015-03-25 DIAGNOSIS — K088 Other specified disorders of teeth and supporting structures: Secondary | ICD-10-CM | POA: Diagnosis present

## 2015-03-25 DIAGNOSIS — Z87891 Personal history of nicotine dependence: Secondary | ICD-10-CM | POA: Diagnosis not present

## 2015-03-25 DIAGNOSIS — Z8742 Personal history of other diseases of the female genital tract: Secondary | ICD-10-CM | POA: Insufficient documentation

## 2015-03-25 DIAGNOSIS — Z862 Personal history of diseases of the blood and blood-forming organs and certain disorders involving the immune mechanism: Secondary | ICD-10-CM | POA: Diagnosis not present

## 2015-03-25 DIAGNOSIS — Z8669 Personal history of other diseases of the nervous system and sense organs: Secondary | ICD-10-CM | POA: Insufficient documentation

## 2015-03-25 DIAGNOSIS — Z87440 Personal history of urinary (tract) infections: Secondary | ICD-10-CM | POA: Diagnosis not present

## 2015-03-25 DIAGNOSIS — K047 Periapical abscess without sinus: Secondary | ICD-10-CM | POA: Diagnosis not present

## 2015-03-25 MED ORDER — ONDANSETRON 4 MG PO TBDP
4.0000 mg | ORAL_TABLET | Freq: Once | ORAL | Status: AC
  Administered 2015-03-25: 4 mg via ORAL

## 2015-03-25 MED ORDER — ONDANSETRON 4 MG PO TBDP
ORAL_TABLET | ORAL | Status: AC
  Filled 2015-03-25: qty 1

## 2015-03-25 MED ORDER — NAPROXEN 500 MG PO TABS: 500.0000 mg | ORAL_TABLET | Freq: Two times a day (BID) | ORAL | Status: DC

## 2015-03-25 MED ORDER — BUPIVACAINE-EPINEPHRINE (PF) 0.5% -1:200000 IJ SOLN
1.8000 mL | Freq: Once | INTRAMUSCULAR | Status: AC
  Administered 2015-03-25: 1.8 mL
  Filled 2015-03-25: qty 1.8

## 2015-03-25 MED ORDER — AMOXICILLIN 500 MG PO CAPS
500.0000 mg | ORAL_CAPSULE | Freq: Three times a day (TID) | ORAL | Status: DC
Start: 2015-03-25 — End: 2015-04-12

## 2015-03-25 NOTE — ED Provider Notes (Signed)
CSN: [id number]     Arrival date & time 03/25/15  1614 History   First MD Initiated Contact with Patient 03/25/15 1621     Chief Complaint  Patient presents with  . Dental Pain     (Consider location/radiation/quality/duration/timing/severity/associated sxs/prior Treatment) HPI Comments: 34 year old female complaining of dental pain 3 days. Pain right lower, worsening today, described as throbbing "like a neon light", worse if she presses on the area, radiating to her right ear. Efraim Kaufmann with temporary relief along with Tylenol. Denies fevers. States it feels like the area is starting to swell. On the way to the ED, she got nauseous and felt like "my soul was going to be vomited up". States she was unable to vomit due to a history of gastric bypass. No abdominal pain, just states the pain is so severe it making her nauseous.  Patient is a 34 y.o. female presenting with tooth pain. The history is provided by the patient.  Dental Pain   Past Medical History  Diagnosis Date  . Hx of gastric bypass 2004  . Morbid obesity   . Anemia   . Substance abuse     Tobacco, ETOH  . Urinary tract infection   . Ovarian cyst   . Insomnia   . Depression     no meds x 1 yr   Past Surgical History  Procedure Laterality Date  . Gastric bypass  01/23/03  . Breast surgery  12/25/08    Reduction  . Hernia repair  11/13/04  . Cholecystectomy  08/09/02  . Cesarean section  2001, 2005, 2009  . Induced abortion    . Breast reduction surgery     Family History  Problem Relation Age of Onset  . Other Neg Hx    Social History  Substance Use Topics  . Smoking status: Former Smoker -- 0.25 packs/day for 10 years    Types: Cigarettes    Start date: 09/14/2002  . Smokeless tobacco: Never Used     Comment: very light smoker  . Alcohol Use: No   OB History    Gravida Para Term Preterm AB TAB SAB Ectopic Multiple Living   0 0 0 4     Review of Systems  HENT: Positive for dental  problem.   All other systems reviewed and are negative.     Allergies  Review of patient's allergies indicates no known allergies.  Home Medications   Prior to Admission medications   Medication Sig Start Date End Date Taking? Authorizing Provider  ALPRAZolam (XANAX) 0.25 MG tablet Take 1 mg by mouth 2 (two) times daily as needed for anxiety.    Historical Provider, MD  amoxicillin (AMOXIL) 500 MG capsule Take 1 capsule (500 mg total) by mouth 3 (three) times daily. 03/25/15   Kathrynn Speed, PA-C  butalbital-acetaminophen-caffeine (FIORICET WITH CODEINE) 50-325-40-30 MG per capsule Take 2 capsules by mouth every 4 (four) hours as needed for headache or migraine.    Historical Provider, MD  ibuprofen (ADVIL) 200 MG tablet Take 200 mg by mouth as needed. ADVIL PM FOR SLEEP    Historical Provider, MD  naproxen (NAPROSYN) 500 MG tablet Take 1 tablet (500 mg total) by mouth 2 (two) times daily. 03/25/15   Kathrynn Speed, PA-C  sertraline (ZOLOFT) 25 MG tablet Take 25 mg by mouth daily.    Historical Provider, MD   BP 150/110 mmHg  Pulse 108  Temp(Src) 98.4 F (36.9 C) (Oral)  Resp 18  Ht 5\' 1"  (1.549 m)  Wt 215 lb (97.523 kg)  BMI 40.64 kg/m2  SpO2 100%  LMP 03/05/2015 Physical Exam  Constitutional: She is oriented to person, place, and time. She appears well-developed and well-nourished. No distress.  HENT:  Head: Normocephalic and atraumatic.  Mouth/Throat: Uvula is midline and oropharynx is clear and moist.    No tenderness or swelling to floor of mouth.  Eyes: Conjunctivae and EOM are normal.  Neck: Normal range of motion. Neck supple.  Cardiovascular: Normal rate, regular rhythm and normal heart sounds.   Pulmonary/Chest: Effort normal and breath sounds normal. No respiratory distress.  Musculoskeletal: Normal range of motion. She exhibits no edema.  Neurological: She is alert and oriented to person, place, and time. No sensory deficit.  Skin: Skin is warm and dry.    Psychiatric: She has a normal mood and affect. Her behavior is normal.  Nursing note and vitals reviewed.   ED Course  NERVE BLOCK Date/Time: 03/25/2015 4:41 PM Performed by: Kathrynn Speed Authorized by: Kathrynn Speed Consent: Verbal consent obtained. Risks and benefits: risks, benefits and alternatives were discussed Consent given by: patient Patient understanding: patient states understanding of the procedure being performed Required items: required blood products, implants, devices, and special equipment available Patient identity confirmed: verbally with patient and arm band Time out: Immediately prior to procedure a "time out" was called to verify the correct patient, procedure, equipment, support staff and site/side marked as required. Indications: pain relief Body area: face/mouth Nerve: inferior alveolar Laterality: right Needle gauge: 25 G Location technique: anatomical landmarks Local anesthetic: bupivacaine 0.5% with epinephrine Anesthetic total: 1.8 ml Outcome: pain improved   (including critical care time) Labs Review Labs Reviewed - No data to display  Imaging Review No results found. I, Celene Skeen, personally reviewed and evaluated these images and lab results as part of my medical decision-making.   EKG Interpretation None      MDM   Final diagnoses:  Pain, dental  Dental infection   Nontoxic appearing, NAD. Afebrile. Pain improved with dental block. Rx Amoxil. No dental abscess or signs/symptoms of Ludwig angina. Naproxen for pain. Follow-up with her dentist within 1-2 days. Stable for discharge. Return precautions given. Patient states understanding of treatment care plan and is agreeable.  Kathrynn Speed, PA-C 03/25/15 1653  Mirian Mo, MD 03/31/15 [phone]

## 2015-03-25 NOTE — ED Notes (Signed)
Dental box is at bedside.

## 2015-03-25 NOTE — ED Notes (Signed)
Dental pain x 3 days.  

## 2015-03-25 NOTE — Discharge Instructions (Signed)
Take amoxicillin as directed for 1 week. Take naproxen for pain. Follow up with your dentist within 1-2 days.  Dental Pain A tooth ache may be caused by cavities (tooth decay). Cavities expose the nerve of the tooth to air and hot or cold temperatures. It may come from an infection or abscess (also called a boil or furuncle) around your tooth. It is also often caused by dental caries (tooth decay). This causes the pain you are having. DIAGNOSIS  Your caregiver can diagnose this problem by exam. TREATMENT   If caused by an infection, it may be treated with medications which kill germs (antibiotics) and pain medications as prescribed by your caregiver. Take medications as directed.  Only take over-the-counter or prescription medicines for pain, discomfort, or fever as directed by your caregiver.  Whether the tooth ache today is caused by infection or dental disease, you should see your dentist as soon as possible for further care. SEEK MEDICAL CARE IF: The exam and treatment you received today has been provided on an emergency basis only. This is not a substitute for complete medical or dental care. If your problem worsens or new problems (symptoms) appear, and you are unable to meet with your dentist, call or return to this location. SEEK IMMEDIATE MEDICAL CARE IF:   You have a fever.  You develop redness and swelling of your face, jaw, or neck.  You are unable to open your mouth.  You have severe pain uncontrolled by pain medicine. MAKE SURE YOU:   Understand these instructions.  Will watch your condition.  Will get help right away if you are not doing well or get worse. Document Released: 07/27/2005 Document Revised: 10/19/2011 Document Reviewed: 03/14/2008 [facility] Patient Information 2015 [place], [place]. This information is not intended to replace advice given to you by your health care provider. Make sure you discuss any questions you have with your health care  provider.  Facial Infection You have an infection of your face. This requires special attention to help prevent serious problems. Infections in facial wounds can cause poor healing and scars. They can also spread to deeper tissues, especially around the eye. Wound and dental infections can lead to sinusitis, infection of the eye socket, and even meningitis. Permanent damage to the skin, eye, and nervous system may result if facial infections are not treated properly. With severe infections, hospital care for IV antibiotic injections may be needed if they don't respond to oral antibiotics. Antibiotics must be taken for the full course to insure the infection is eliminated. If the infection came from a bad tooth, it may have to be extracted when the infection is under control. Warm compresses may be applied to reduce skin irritation and remove drainage. You might need a tetanus shot now if:  You cannot remember when your last tetanus shot was.  You have never had a tetanus shot.  The object that caused your wound was dirty. If you need a tetanus shot, and you decide not to get one, there is a rare chance of getting tetanus. Sickness from tetanus can be serious. If you got a tetanus shot, your arm may swell, get red and warm to the touch at the shot site. This is common and not a problem. SEEK IMMEDIATE MEDICAL CARE IF:   You have increased swelling, redness, or trouble breathing.  You have a severe headache, dizziness, nausea, or vomiting.  You develop problems with your eyesight.  You have a fever. Document Released: 09/03/2004 Document Revised:  10/19/2011 Document Reviewed: 07/27/2005 [employer] Patient Information 2015 [place], [place]. This information is not intended to replace advice given to you by your health care provider. Make sure you discuss any questions you have with your health care provider.

## 2015-03-26 ENCOUNTER — Telehealth: Payer: Self-pay | Admitting: Hematology & Oncology

## 2015-03-26 NOTE — Telephone Encounter (Signed)
Lt mess regarding future appt. 8/23

## 2015-04-02 ENCOUNTER — Other Ambulatory Visit: Payer: Self-pay

## 2015-04-02 ENCOUNTER — Ambulatory Visit: Payer: Self-pay | Admitting: Hematology & Oncology

## 2015-04-03 ENCOUNTER — Telehealth: Payer: Self-pay | Admitting: Hematology & Oncology

## 2015-04-03 NOTE — Telephone Encounter (Signed)
Returned pt call regarding future appt date and time

## 2015-04-11 ENCOUNTER — Other Ambulatory Visit: Payer: Self-pay | Admitting: *Deleted

## 2015-04-11 DIAGNOSIS — E611 Iron deficiency: Secondary | ICD-10-CM

## 2015-04-12 ENCOUNTER — Encounter: Payer: Self-pay | Admitting: Family

## 2015-04-12 ENCOUNTER — Ambulatory Visit (HOSPITAL_BASED_OUTPATIENT_CLINIC_OR_DEPARTMENT_OTHER): Payer: Managed Care, Other (non HMO) | Admitting: Family

## 2015-04-12 ENCOUNTER — Ambulatory Visit (HOSPITAL_BASED_OUTPATIENT_CLINIC_OR_DEPARTMENT_OTHER): Payer: Managed Care, Other (non HMO)

## 2015-04-12 ENCOUNTER — Other Ambulatory Visit (HOSPITAL_BASED_OUTPATIENT_CLINIC_OR_DEPARTMENT_OTHER): Payer: Managed Care, Other (non HMO)

## 2015-04-12 VITALS — BP 133/92 | HR 74 | Temp 98.5°F | Ht 61.0 in | Wt 213.0 lb

## 2015-04-12 DIAGNOSIS — N920 Excessive and frequent menstruation with regular cycle: Secondary | ICD-10-CM | POA: Diagnosis not present

## 2015-04-12 DIAGNOSIS — D500 Iron deficiency anemia secondary to blood loss (chronic): Secondary | ICD-10-CM

## 2015-04-12 DIAGNOSIS — E611 Iron deficiency: Secondary | ICD-10-CM

## 2015-04-12 DIAGNOSIS — K909 Intestinal malabsorption, unspecified: Secondary | ICD-10-CM

## 2015-04-12 DIAGNOSIS — Z9884 Bariatric surgery status: Secondary | ICD-10-CM

## 2015-04-12 DIAGNOSIS — D508 Other iron deficiency anemias: Secondary | ICD-10-CM | POA: Diagnosis not present

## 2015-04-12 LAB — CBC WITH DIFFERENTIAL (CANCER CENTER ONLY)
BASO#: 0 10*3/uL (ref 0.0–0.2)
BASO%: 0.7 % (ref 0.0–2.0)
EOS%: 0.7 % (ref 0.0–7.0)
Eosinophils Absolute: 0 10*3/uL (ref 0.0–0.5)
HEMATOCRIT: 26.9 % — AB (ref 34.8–46.6)
HGB: 7.7 g/dL — ABNORMAL LOW (ref 11.6–15.9)
LYMPH#: 2.9 10*3/uL (ref 0.9–3.3)
LYMPH%: 47.8 % (ref 14.0–48.0)
MCH: 17.5 pg — ABNORMAL LOW (ref 26.0–34.0)
MCHC: 28.6 g/dL — ABNORMAL LOW (ref 32.0–36.0)
MCV: 61 fL — ABNORMAL LOW (ref 81–101)
MONO#: 0.7 10*3/uL (ref 0.1–0.9)
MONO%: 10.8 % (ref 0.0–13.0)
NEUT#: 2.4 10*3/uL (ref 1.5–6.5)
NEUT%: 40 % (ref 39.6–80.0)
PLATELETS: 465 10*3/uL — AB (ref 145–400)
RBC: 4.41 10*6/uL (ref 3.70–5.32)
RDW: 20.4 % — ABNORMAL HIGH (ref 11.1–15.7)
WBC: 6 10*3/uL (ref 3.9–10.0)

## 2015-04-12 LAB — CHCC SATELLITE - SMEAR

## 2015-04-12 MED ORDER — SODIUM CHLORIDE 0.9 % IV SOLN
510.0000 mg | Freq: Once | INTRAVENOUS | Status: AC
  Administered 2015-04-12: 510 mg via INTRAVENOUS
  Filled 2015-04-12: qty 17

## 2015-04-12 MED ORDER — FOLIC ACID 1 MG PO TABS: 1.0000 mg | ORAL_TABLET | Freq: Every day | ORAL | Status: DC

## 2015-04-12 NOTE — Patient Instructions (Signed)

## 2015-04-12 NOTE — Progress Notes (Signed)
Hematology and Oncology Follow Up Visit  Jennifer Brown [id number] 1980-08-28 34 y.o. 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 She is feeling very fatigued and sleeping more throughout the day than usual. She is also chewing ice "all the time" and having dizzy spells.  She is currently on her cycle. Her cycles are still heavy. No clots.  She has had a tooth ache for several weeks and is currently on an antibiotic. She developed a yeast infection with this that has resolved with Monostat.  She has had no fever, n/v, cough, rash, headache, SOB, chest pain, palpitations, abdominal pain, changes in bowel or bladder habits. She has not noticed any blood in her urine or stool.  She has had no swelling, tenderness, numbness or tingling in her extremities. No c/o aches or pains.  She is eating healthy and staying hydrated. Her weight is stable.  She is working full time as a Conservation officer, nature.   Medications:    Medication List       This list is accurate as of: 04/12/15  1:39 PM.  Always use your most recent med list.               ADVIL 200 MG tablet  Generic drug:  ibuprofen  Take 200 mg by mouth as needed. ADVIL PM FOR SLEEP     busPIRone 15 MG tablet  Commonly known as:  BUSPAR  TAKE  (1/3) TABLET 3 TIMES A DAY AS NEEDED FOR ANXIETY     diphenhydramine-acetaminophen 25-500 MG Tabs  Commonly known as:  TYLENOL PM  Take by mouth.     folic acid 1 MG tablet  Commonly known as:  FOLVITE  Take 1 tablet (1 mg total) by mouth daily.     naproxen 500 MG tablet  Commonly known as:  NAPROSYN  Take 1 tablet (500 mg total) by mouth 2 (two) times daily.     sertraline 25 MG tablet  Commonly known as:  ZOLOFT  TAKE 1 TABLET (25 MG TOTAL) BY MOUTH DAILY.        Allergies: No Known Allergies  Past  Medical History, Surgical history, Social history, and Family History were reviewed and updated.  Review of Systems: All other 10 point review of systems is negative.   Physical Exam:  height is  (1.549 m) and weight is 213 lb (96.616 kg). Her oral temperature is 98.5 F (36.9 C). Her blood pressure is 133/92 and her pulse is 74.   Wt Readings from Last 3 Encounters:  04/12/15 213 lb (96.616 kg)  03/25/15 215 lb (97.523 kg)  09/14/13 211 lb (95.709 kg)    Ocular: Sclerae unicteric, pupils equal, round and reactive to light Ear-nose-throat: Oropharynx clear, dentition fair Lymphatic: No cervical or supraclavicular adenopathy Lungs no rales or rhonchi, good excursion bilaterally Heart regular rate and rhythm, no murmur appreciated Abd soft, nontender, positive bowel sounds MSK no focal spinal tenderness, no joint edema Neuro: non-focal, well-oriented, appropriate affect Breasts: Deferred  Lab Results  Component Value Date   WBC 6.0 04/12/2015   HGB 7.7* 04/12/2015   HCT 26.9* 04/12/2015   MCV 61* 04/12/2015   PLT 465* 04/12/2015   Lab Results  Component Value Date   FERRITIN 12 06/19/2013   IRON 73 09/14/2013  TIBC 345 09/14/2013   UIBC 272 09/14/2013   IRONPCTSAT 21 09/14/2013   Lab Results  Component Value Date   RETICCTPCT 1.1 09/14/2013   RBC 4.41 04/12/2015   RETICCTABS 56.2 09/14/2013   No results found for: KPAFRELGTCHN, LAMBDASER, KAPLAMBRATIO No results found for: IGGSERUM, IGA, IGMSERUM No results found for: Marda Stalker, SPEI   Chemistry      Component Value Date/Time   NA 143 06/08/2014 1212   K 3.6* 06/08/2014 1212   CL 106 06/08/2014 1212   CO2 24 06/08/2014 1212   BUN 7 06/08/2014 1212   CREATININE 0.62 06/08/2014 1212      Component Value Date/Time   CALCIUM 8.8 06/08/2014 1212   ALKPHOS 55 06/08/2014 1212   AST 24 06/08/2014 1212   ALT 21 06/08/2014 1212   BILITOT 0.3 06/08/2014  1212     Impression and Plan: [name] is a pleasant 34 yo African American female with iron-deficiency anemia secondary to malabsorption (gastric bypass) and menorrhagia. She is symptomatic with fatigue, dizziness and chewing ice. Her Hgb today is 7.7 with an MCV of 61. Her iron studies are pending but I am sure they will be quite low.  We will give her a dose of feraheme today and then again in 8 days.  I also gave her a prescription for folic acid 1 mg daily.  We will plan to see her back in 2 months for labs and follow-up.  She knows to contact us with any questions or concerns. We can certainly see her sooner if need be.   Verdie Mosher, NP 9/2/20161:39 PM

## 2015-04-16 LAB — IRON AND TIBC CHCC
%SAT: 3 % — AB (ref 21–57)
Iron: 14 ug/dL — ABNORMAL LOW (ref 41–142)
TIBC: 513 ug/dL — AB (ref 236–444)
UIBC: 498 ug/dL — AB (ref 120–384)

## 2015-04-16 LAB — FERRITIN CHCC: Ferritin: 14 ng/ml (ref 9–269)

## 2015-04-17 ENCOUNTER — Telehealth: Payer: Self-pay | Admitting: *Deleted

## 2015-04-17 NOTE — Telephone Encounter (Addendum)
Patient already received one dose of feraheme and is scheduled for an additional dose this week. Dr Myna Hidalgo notified.    ----- Message from Josph Macho, MD sent at 04/17/2015 12:14 PM EDT ----- Please call and tell her that the iron level is low. She needs 2 doses of Feraheme. Please set this up. Thank you

## 2015-04-19 ENCOUNTER — Ambulatory Visit (HOSPITAL_BASED_OUTPATIENT_CLINIC_OR_DEPARTMENT_OTHER): Payer: Managed Care, Other (non HMO)

## 2015-04-19 VITALS — BP 111/72 | HR 81 | Temp 98.0°F | Resp 16 | Ht 61.0 in | Wt 211.0 lb

## 2015-04-19 DIAGNOSIS — Z9884 Bariatric surgery status: Secondary | ICD-10-CM

## 2015-04-19 DIAGNOSIS — E611 Iron deficiency: Secondary | ICD-10-CM | POA: Diagnosis not present

## 2015-04-19 MED ORDER — FERUMOXYTOL INJECTION 510 MG/17 ML
510.0000 mg | Freq: Once | INTRAVENOUS | Status: AC
  Administered 2015-04-19: 510 mg via INTRAVENOUS
  Filled 2015-04-19: qty 17

## 2015-04-19 NOTE — Patient Instructions (Signed)

## 2015-04-24 LAB — RETICULOCYTES (CHCC)
ABS RETIC: 103 10*3/uL (ref 19.0–186.0)
RBC.: 4.48 MIL/uL (ref 3.87–5.11)
Retic Ct Pct: 2.3 % (ref 0.4–2.3)

## 2015-04-24 LAB — VITAMIN B12 DEFICIENCY PANEL - CHCC

## 2015-05-07 ENCOUNTER — Emergency Department (HOSPITAL_BASED_OUTPATIENT_CLINIC_OR_DEPARTMENT_OTHER)
Admission: EM | Admit: 2015-05-07 | Discharge: 2015-05-07 | Disposition: A | Discharge status: Home / Self Care | Payer: Managed Care, Other (non HMO) | Attending: Emergency Medicine | Admitting: Emergency Medicine

## 2015-05-07 ENCOUNTER — Encounter (HOSPITAL_BASED_OUTPATIENT_CLINIC_OR_DEPARTMENT_OTHER): Payer: Self-pay | Admitting: *Deleted

## 2015-05-07 DIAGNOSIS — D649 Anemia, unspecified: Secondary | ICD-10-CM | POA: Diagnosis not present

## 2015-05-07 DIAGNOSIS — F329 Major depressive disorder, single episode, unspecified: Secondary | ICD-10-CM | POA: Diagnosis not present

## 2015-05-07 DIAGNOSIS — Z3202 Encounter for pregnancy test, result negative: Secondary | ICD-10-CM | POA: Diagnosis not present

## 2015-05-07 DIAGNOSIS — Z87891 Personal history of nicotine dependence: Secondary | ICD-10-CM | POA: Insufficient documentation

## 2015-05-07 DIAGNOSIS — Z8669 Personal history of other diseases of the nervous system and sense organs: Secondary | ICD-10-CM | POA: Diagnosis not present

## 2015-05-07 DIAGNOSIS — Z87440 Personal history of urinary (tract) infections: Secondary | ICD-10-CM | POA: Insufficient documentation

## 2015-05-07 DIAGNOSIS — N939 Abnormal uterine and vaginal bleeding, unspecified: Secondary | ICD-10-CM

## 2015-05-07 LAB — URINALYSIS, ROUTINE W REFLEX MICROSCOPIC
BILIRUBIN URINE: NEGATIVE
Glucose, UA: NEGATIVE mg/dL
KETONES UR: NEGATIVE mg/dL
Leukocytes, UA: NEGATIVE
Nitrite: NEGATIVE
PH: 6 (ref 5.0–8.0)
Protein, ur: NEGATIVE mg/dL
SPECIFIC GRAVITY, URINE: 1.025 (ref 1.005–1.030)
Urobilinogen, UA: 0.2 mg/dL (ref 0.0–1.0)

## 2015-05-07 LAB — WET PREP, GENITAL
Clue Cells Wet Prep HPF POC: NONE SEEN
Trich, Wet Prep: NONE SEEN
YEAST WET PREP: NONE SEEN

## 2015-05-07 LAB — BASIC METABOLIC PANEL
Anion gap: 6 (ref 5–15)
BUN: 18 mg/dL (ref 6–20)
CALCIUM: 8.9 mg/dL (ref 8.9–10.3)
CHLORIDE: 108 mmol/L (ref 101–111)
CO2: 26 mmol/L (ref 22–32)
CREATININE: 0.64 mg/dL (ref 0.44–1.00)
GFR calc Af Amer: >60 mL/min (ref >60)
Glucose, Bld: 93 mg/dL (ref 65–99)
Potassium: 4 mmol/L (ref 3.5–5.1)
SODIUM: 140 mmol/L (ref 135–145)

## 2015-05-07 LAB — CBC WITH DIFFERENTIAL/PLATELET
BASOS PCT: 0 %
Basophils Absolute: 0 10*3/uL (ref 0.0–0.1)
Eosinophils Absolute: 0.1 10*3/uL (ref 0.0–0.7)
Eosinophils Relative: 1 %
HCT: 37 % (ref 36.0–46.0)
Hemoglobin: 11.4 g/dL — ABNORMAL LOW (ref 12.0–15.0)
LYMPHS ABS: 3.3 10*3/uL (ref 0.7–4.0)
Lymphocytes Relative: 39 %
MCH: 22.7 pg — AB (ref 26.0–34.0)
MCHC: 30.8 g/dL (ref 30.0–36.0)
MCV: 73.7 fL — ABNORMAL LOW (ref 78.0–100.0)
MONO ABS: 0.7 10*3/uL (ref 0.1–1.0)
Monocytes Relative: 8 %
NEUTROS ABS: 4.4 10*3/uL (ref 1.7–7.7)
NEUTROS PCT: 52 %
PLATELETS: 379 10*3/uL (ref 150–400)
RBC: 5.02 MIL/uL (ref 3.87–5.11)
WBC: 8.5 10*3/uL (ref 4.0–10.5)

## 2015-05-07 LAB — URINE MICROSCOPIC-ADD ON

## 2015-05-07 LAB — PREGNANCY, URINE: Preg Test, Ur: NEGATIVE

## 2015-05-07 MED ORDER — KETOROLAC TROMETHAMINE 60 MG/2ML IM SOLN
60.0000 mg | Freq: Once | INTRAMUSCULAR | Status: AC
  Administered 2015-05-07: 60 mg via INTRAMUSCULAR
  Filled 2015-05-07: qty 2

## 2015-05-07 MED ORDER — NAPROXEN 500 MG PO TABS: 500.0000 mg | ORAL_TABLET | Freq: Two times a day (BID) | ORAL | Status: DC

## 2015-05-07 NOTE — ED Notes (Signed)
Assisted with pelvic exam.  Pt. Very sarcastic to PA about his exam with her.  Pt. Sarcastic about PA explanation and concern for her care with OBGYN to  Follow up with her possible needs.

## 2015-05-07 NOTE — ED Notes (Signed)
In to reassess the pt.  Pt. Tearful with complaints of bleeding and not wanting to smell her own blood.  Pt. Will not let RN see pad she is sitting on.  Pt. C/o time she has been waiting.  RN reassures Pt. She will see about getting Pelvic done as soon as possible.  Pt. Reports she wants something for her abd. Pain.  Pt. Has had abd. Cramping and vaginal bleeding for 3 weeks.

## 2015-05-07 NOTE — Discharge Instructions (Signed)

## 2015-05-07 NOTE — ED Notes (Signed)
Pt. Reports to RN she thought if she came to ED with bleeding she could have poss. Immediate surgery done for the bleeding that was no normal.  RN explains that only on emergent basis.

## 2015-05-07 NOTE — ED Notes (Signed)
Pt. Reports she is so tired.

## 2015-05-07 NOTE — ED Provider Notes (Signed)
CSN: [id number]     Arrival date & time 05/07/15  1419 History   First MD Initiated Contact with Patient 05/07/15 1427     Chief Complaint  Patient presents with  . Abdominal Pain  . Vaginal Bleeding     (Consider location/radiation/quality/duration/timing/severity/associated sxs/prior Treatment) HPI   34 year old female here for evaluation of vaginal bleeding.  Patient reports she normally has menstrual periods that last 8 days. For the past several months she has at least 2 menstrual cycle per month. For the past 3 weeks she has had persistent vaginal bleeding with low abdominal cramping. Her abdominal cramping is getting progressively worse and now 10 out of 10 planned suprapubic region. Her bleeding is heavy requiring her to wear adult diaper at night. For the past several days she noticed increased darker blood with clots. She also endorsed feeling lightheadedness, general fatigue, and having occasional low back pain radiates to the right lower extremities. She has history of anemia requiring iron transfusions, last transfuse with several weeks ago. She denies having fever, chills, chest pain, shortness of breath, dysuria, vaginal discharge, or rash. Appetite has been normal.    Past Medical History  Diagnosis Date  . Hx of gastric bypass 2004  . Morbid obesity   . Anemia   . Substance abuse     Tobacco, ETOH  . Urinary tract infection   . Ovarian cyst   . Insomnia   . Depression     no meds x 1 yr   Past Surgical History  Procedure Laterality Date  . Gastric bypass  01/23/03  . Breast surgery  12/25/08    Reduction  . Hernia repair  11/13/04  . Cholecystectomy  08/09/02  . Cesarean section  2001, 2005, 2009  . Induced abortion    . Breast reduction surgery     Family History  Problem Relation Age of Onset  . Other Neg Hx    Social History  Substance Use Topics  . Smoking status: Former Smoker -- 0.25 packs/day for 10 years    Types: Cigarettes    Start date:  09/14/2002  . Smokeless tobacco: Never Used     Comment: very light smoker  . Alcohol Use: No   OB History    Gravida Para Term Preterm AB TAB SAB Ectopic Multiple Living   0 0 0 4     Review of Systems  All other systems reviewed and are negative.      Allergies  Review of patient's allergies indicates no known allergies.  Home Medications   Prior to Admission medications   Medication Sig Start Date End Date Taking? Authorizing Provider  busPIRone (BUSPAR) 15 MG tablet TAKE  (1/3) TABLET 3 TIMES A DAY AS NEEDED FOR ANXIETY 10/19/14   Historical Provider, MD  diphenhydramine-acetaminophen (TYLENOL PM) 25-500 MG TABS Take by mouth.    Historical Provider, MD  folic acid (FOLVITE) 1 MG tablet Take 1 tablet (1 mg total) by mouth daily. 04/12/15   Verdie Mosher, NP  ibuprofen (ADVIL) 200 MG tablet Take 200 mg by mouth as needed. ADVIL PM FOR SLEEP    Historical Provider, MD  naproxen (NAPROSYN) 500 MG tablet Take 1 tablet (500 mg total) by mouth 2 (two) times daily. 03/25/15   Robyn M Hess, PA-C  sertraline (ZOLOFT) 25 MG tablet TAKE 1 TABLET (25 MG TOTAL) BY MOUTH DAILY. 12/20/14   Historical Provider, MD   BP 129/79 mmHg  Pulse  87  Temp(Src) 98.1 F (36.7 C) (Oral)  Resp 20  Ht  (1.549 m)  Wt 211 lb (95.709 kg)  BMI 39.89 kg/m2  SpO2 96%  LMP 04/16/2015 Physical Exam  Constitutional: She appears well-developed and well-nourished. No distress.  Obese African-American female appears to be in no acute distress, nontoxic.  HENT:  Head: Atraumatic.  Eyes: Conjunctivae are normal.  Neck: Neck supple.  Cardiovascular: Normal rate and regular rhythm.   Pulmonary/Chest: Effort normal and breath sounds normal.  Abdominal: Soft. Bowel sounds are normal. She exhibits no distension. There is tenderness (suprapubic tenderness without guarding or rebound tenderness.).  Genitourinary:  Chaperone present during exam. Examination is limited due to large body  habitus. No inguinal lymphadenopathy or inguinal hernia noted. Normal external genitalia. Moderate blood noted in vaginal vault. Close cervical os free of lesion or rash. On bimanual examination minimal tenderness to bilateral adnexal region but no cervical motion tenderness. No obvious mass noted.  Neurological: She is alert.  Skin: No rash noted.  Psychiatric: She has a normal mood and affect.  Nursing note and vitals reviewed.   ED Course  Procedures (including critical care time)   Patient here with abnormal uterine bleeding consistence with menorrhagia. No significant bleeding noted on pelvic examination. Her labs are reassuring. Patient will benefit from following up with GYN for further management. Return precautions discussed. Patient is afebrile with stable normal vital sign.  Labs Review Labs Reviewed  WET PREP, GENITAL - Abnormal; Notable for the following:    WBC, Wet Prep HPF POC FEW (*)    All other components within normal limits  URINALYSIS, ROUTINE W REFLEX MICROSCOPIC (NOT AT [facility]) - Abnormal; Notable for the following:    Hgb urine dipstick LARGE (*)    All other components within normal limits  CBC WITH DIFFERENTIAL/PLATELET - Abnormal; Notable for the following:    Hemoglobin 11.4 (*)    MCV 73.7 (*)    MCH 22.7 (*)    All other components within normal limits  URINE MICROSCOPIC-ADD ON - Abnormal; Notable for the following:    Bacteria, UA FEW (*)    All other components within normal limits  PREGNANCY, URINE  BASIC METABOLIC PANEL  GC/CHLAMYDIA PROBE AMP ([place]) NOT AT [facility]    Imaging Review No results found. I have personally reviewed and evaluated these images and lab results as part of my medical decision-making.   EKG Interpretation None      MDM   Final diagnoses:  Abnormal uterine bleeding    BP 141/90 mmHg  Pulse 87  Temp(Src) 98.1 F (36.7 C) (Oral)  Resp 20  Ht  (1.549 m)  Wt 211 lb (95.709 kg)  BMI 39.89 kg/m2  SpO2  97%  LMP 04/16/2015     Fayrene Helper, PA-C 05/07/15 1833  Lavera Guise, MD 05/08/15 0126

## 2015-05-07 NOTE — ED Notes (Signed)
Pt. Reports she has no change in her pain or feelings.  There was minimal bleeding on pad at time RN assessed the Pt. After the Pelvic.

## 2015-05-07 NOTE — ED Notes (Signed)
Pt. Had last iron infusion on Sept. 9 and Sept. 2 2016 due to Iron 5 or 6

## 2015-05-07 NOTE — ED Notes (Signed)
Abdominal pain. Vaginal bleeding x 3 weeks.

## 2015-05-08 LAB — GC/CHLAMYDIA PROBE AMP (~~LOC~~) NOT AT ARMC
CHLAMYDIA, DNA PROBE: NEGATIVE
NEISSERIA GONORRHEA: NEGATIVE

## 2015-06-21 ENCOUNTER — Other Ambulatory Visit: Payer: Managed Care, Other (non HMO)

## 2015-06-21 ENCOUNTER — Ambulatory Visit: Payer: Self-pay | Admitting: Hematology & Oncology

## 2015-11-24 ENCOUNTER — Encounter (HOSPITAL_BASED_OUTPATIENT_CLINIC_OR_DEPARTMENT_OTHER): Payer: Self-pay | Admitting: Emergency Medicine

## 2015-11-24 ENCOUNTER — Emergency Department (HOSPITAL_BASED_OUTPATIENT_CLINIC_OR_DEPARTMENT_OTHER)
Admission: EM | Admit: 2015-11-24 | Discharge: 2015-11-24 | Disposition: A | Discharge status: Home / Self Care | Payer: Managed Care, Other (non HMO) | Attending: Emergency Medicine | Admitting: Emergency Medicine

## 2015-11-24 DIAGNOSIS — Z87891 Personal history of nicotine dependence: Secondary | ICD-10-CM | POA: Diagnosis not present

## 2015-11-24 DIAGNOSIS — F329 Major depressive disorder, single episode, unspecified: Secondary | ICD-10-CM | POA: Insufficient documentation

## 2015-11-24 DIAGNOSIS — R1032 Left lower quadrant pain: Secondary | ICD-10-CM | POA: Insufficient documentation

## 2015-11-24 DIAGNOSIS — N390 Urinary tract infection, site not specified: Secondary | ICD-10-CM | POA: Diagnosis not present

## 2015-11-24 DIAGNOSIS — R1031 Right lower quadrant pain: Secondary | ICD-10-CM | POA: Diagnosis present

## 2015-11-24 LAB — URINALYSIS, ROUTINE W REFLEX MICROSCOPIC
BILIRUBIN URINE: NEGATIVE
Glucose, UA: NEGATIVE mg/dL
KETONES UR: NEGATIVE mg/dL
NITRITE: NEGATIVE
PROTEIN: 100 mg/dL — AB
Specific Gravity, Urine: 1.026 (ref 1.005–1.030)
pH: 6 (ref 5.0–8.0)

## 2015-11-24 LAB — URINE MICROSCOPIC-ADD ON

## 2015-11-24 LAB — PREGNANCY, URINE: Preg Test, Ur: NEGATIVE

## 2015-11-24 MED ORDER — CEPHALEXIN 250 MG PO CAPS
500.0000 mg | ORAL_CAPSULE | Freq: Once | ORAL | Status: AC
  Administered 2015-11-24: 500 mg via ORAL
  Filled 2015-11-24: qty 2

## 2015-11-24 MED ORDER — HYDROCODONE-ACETAMINOPHEN 5-325 MG PO TABS
1.0000 | ORAL_TABLET | Freq: Once | ORAL | Status: AC
  Administered 2015-11-24: 1 via ORAL
  Filled 2015-11-24: qty 1

## 2015-11-24 MED ORDER — PHENAZOPYRIDINE HCL 200 MG PO TABS: 200.0000 mg | ORAL_TABLET | Freq: Three times a day (TID) | ORAL | Status: DC

## 2015-11-24 MED ORDER — HYDROCODONE-ACETAMINOPHEN 5-325 MG PO TABS: 1.0000 | ORAL_TABLET | Freq: Four times a day (QID) | ORAL | Status: DC | PRN

## 2015-11-24 MED ORDER — CEPHALEXIN 500 MG PO CAPS: 500.0000 mg | ORAL_CAPSULE | Freq: Four times a day (QID) | ORAL | Status: DC

## 2015-11-24 MED ORDER — PHENAZOPYRIDINE HCL 100 MG PO TABS
200.0000 mg | ORAL_TABLET | Freq: Once | ORAL | Status: AC
Start: 2015-11-24 — End: 2015-11-24
  Administered 2015-11-24: 200 mg via ORAL
  Filled 2015-11-24: qty 2

## 2015-11-24 NOTE — ED Notes (Signed)
On the way back from the restroom, pt doubled over in pain. Pt given wheelchair, she says sitting is almost unbearable as well.

## 2015-11-24 NOTE — ED Notes (Signed)
Pt states she is unable to get her RX filled until the morning. MD made aware of this and asked if we could give patient a 2nd vicodin at d/c. Orders received.

## 2015-11-24 NOTE — ED Notes (Signed)
Pt in c/o lower abd pain and bilateral flank pain x 3 days but became much worse this evening. States dysuria as well.

## 2015-11-24 NOTE — ED Provider Notes (Signed)
CSN: [id number]     Arrival date & time 11/24/15  1959 History  By signing my name below, I, Iona Beard, attest that this documentation has been prepared under the direction and in the presence of Gwyneth Sprout, MD.   Electronically Signed: Iona Beard, ED Scribe. 11/24/2015. 8:35 PM  Chief Complaint  Patient presents with  . Flank Pain  . Abdominal Pain    The history is provided by the patient. No language interpreter was used.   HPI Comments: Jennifer Brown is a 35 y.o. female who presents to the Emergency Department complaining of gradual onset, dysuria, ongoing for three days, worsening earlier tonight. Pt reports associated bilateral flank pain, nausea, and abdominal pain. She notes some blood when she wipes after finishing urinating. She states her dysuria is worse when she is finishing urination. No other associated symptoms noted. No other worsening or alleviating factors noted. Pt denies rash, vaginal bleeding, vaginal discharge, fever, vomiting, or any other pertinent symptoms.   Past Medical History  Diagnosis Date  . Hx of gastric bypass 2004  . Morbid obesity (HCC)   . Anemia   . Substance abuse     Tobacco, ETOH  . Urinary tract infection   . Ovarian cyst   . Insomnia   . Depression     no meds x 1 yr   Past Surgical History  Procedure Laterality Date  . Gastric bypass  01/23/03  . Breast surgery  12/25/08    Reduction  . Hernia repair  11/13/04  . Cholecystectomy  08/09/02  . Cesarean section  2001, 2005, 2009  . Induced abortion    . Breast reduction surgery     Family History  Problem Relation Age of Onset  . Other Neg Hx    Social History  Substance Use Topics  . Smoking status: Former Smoker -- 0.25 packs/day for 10 years    Types: Cigarettes    Start date: 09/14/2002  . Smokeless tobacco: Never Used     Comment: very light smoker  . Alcohol Use: No   OB History    Gravida Para Term Preterm AB TAB SAB Ectopic Multiple Living   0 0 0 4     Review of Systems A complete 10 system review of systems was obtained and all systems are negative except as noted in the HPI and PMH.   Allergies  Review of patient's allergies indicates no known allergies.  Home Medications   Prior to Admission medications   Medication Sig Start Date End Date Taking? Authorizing Provider  busPIRone (BUSPAR) 15 MG tablet TAKE  (1/3) TABLET 3 TIMES A DAY AS NEEDED FOR ANXIETY 10/19/14   Historical Provider, MD  diphenhydramine-acetaminophen (TYLENOL PM) 25-500 MG TABS Take by mouth.    Historical Provider, MD  folic acid (FOLVITE) 1 MG tablet Take 1 tablet (1 mg total) by mouth daily. 04/12/15   Verdie Mosher, NP  ibuprofen (ADVIL) 200 MG tablet Take 200 mg by mouth as needed. ADVIL PM FOR SLEEP    Historical Provider, MD  naproxen (NAPROSYN) 500 MG tablet Take 1 tablet (500 mg total) by mouth 2 (two) times daily. 05/07/15   Fayrene Helper, PA-C  sertraline (ZOLOFT) 25 MG tablet TAKE 1 TABLET (25 MG TOTAL) BY MOUTH DAILY. 12/20/14   Historical Provider, MD   BP 146/112 mmHg  Pulse 116  Temp(Src) 99 F (37.2 C) (Oral)  Resp 20  Ht  (1.549 m)  Wt 210 lb (95.255 kg)  BMI 39.70 kg/m2  SpO2 98%  LMP 11/15/2015 Physical Exam  Constitutional: She is oriented to person, place, and time. She appears well-developed and well-nourished. No distress.  HENT:  Head: Normocephalic and atraumatic.  Eyes: EOM are normal.  Neck: Normal range of motion.  Cardiovascular: Normal rate, regular rhythm and normal heart sounds.  Exam reveals no gallop.   No murmur heard. Pulmonary/Chest: Effort normal and breath sounds normal. No respiratory distress. She has no wheezes. She has no rales.  Abdominal: Soft. She exhibits no distension and no mass. There is tenderness. There is no rebound and no guarding.  Bilateral flank and suprapubic TTP.   Genitourinary: No vaginal discharge found.  No ulcers or vesicles present at urethra.   Musculoskeletal:  Normal range of motion.  Neurological: She is alert and oriented to person, place, and time.  Skin: Skin is warm and dry.  Psychiatric: She has a normal mood and affect. Judgment normal.  Nursing note and vitals reviewed.   ED Course  Procedures (including critical care time) DIAGNOSTIC STUDIES: Oxygen Saturation is 98% on RA, normal by my interpretation.    COORDINATION OF CARE: 8:57 PM-Discussed treatment plan which includes urinalysis and urine pregnancy with pt at bedside and pt agreed to plan.   Labs Review Labs Reviewed  URINALYSIS, ROUTINE W REFLEX MICROSCOPIC (NOT AT [facility]) - Abnormal; Notable for the following:    APPearance TURBID (*)    Hgb urine dipstick LARGE (*)    Protein, ur 100 (*)    Leukocytes, UA LARGE (*)    All other components within normal limits  URINE MICROSCOPIC-ADD ON - Abnormal; Notable for the following:    Squamous Epithelial / LPF 0-5 (*)    Bacteria, UA MANY (*)    All other components within normal limits  PREGNANCY, URINE    Imaging Review No results found. I have personally reviewed and evaluated these images and lab results as part of my medical decision-making.   EKG Interpretation None      MDM   Final diagnoses:  UTI (lower urinary tract infection)   Patient is a 35 year old female presenting today with symptoms suggestive of a UTI.  No clinical signs consistent with pyelonephritis such as fever, vomiting but she does have some flank tenderness. No vaginal discharge, vesicles over the urethra concerning for herpes and pregnancy negative. UA consistent with UTI. Patient treated with Keflex. Given by radium and pain control. Given strict return precautions.  I personally performed the services described in this documentation, which was scribed in my presence.  The recorded information has been reviewed and considered.    Gwyneth SproutWhitney Emmamae Mcnamara, MD 11/24/15 [phone]

## 2015-11-24 NOTE — ED Notes (Signed)
Dr. Plunkett in room with patient now. 

## 2015-11-25 ENCOUNTER — Encounter (HOSPITAL_BASED_OUTPATIENT_CLINIC_OR_DEPARTMENT_OTHER): Payer: Self-pay | Admitting: Emergency Medicine

## 2015-11-25 ENCOUNTER — Emergency Department (HOSPITAL_BASED_OUTPATIENT_CLINIC_OR_DEPARTMENT_OTHER)
Admission: EM | Admit: 2015-11-25 | Discharge: 2015-11-25 | Disposition: A | Discharge status: Home / Self Care | Payer: Managed Care, Other (non HMO) | Attending: Emergency Medicine | Admitting: Emergency Medicine

## 2015-11-25 DIAGNOSIS — F329 Major depressive disorder, single episode, unspecified: Secondary | ICD-10-CM | POA: Diagnosis not present

## 2015-11-25 DIAGNOSIS — N390 Urinary tract infection, site not specified: Secondary | ICD-10-CM | POA: Diagnosis present

## 2015-11-25 DIAGNOSIS — F17210 Nicotine dependence, cigarettes, uncomplicated: Secondary | ICD-10-CM | POA: Insufficient documentation

## 2015-11-25 MED ORDER — OXYCODONE-ACETAMINOPHEN 5-325 MG PO TABS
2.0000 | ORAL_TABLET | Freq: Once | ORAL | Status: AC
  Administered 2015-11-25: 2 via ORAL
  Filled 2015-11-25: qty 2

## 2015-11-25 MED ORDER — PHENAZOPYRIDINE HCL 100 MG PO TABS
95.0000 mg | ORAL_TABLET | Freq: Once | ORAL | Status: AC
  Administered 2015-11-25: 100 mg via ORAL
  Filled 2015-11-25: qty 1

## 2015-11-25 NOTE — ED Notes (Signed)
Pt was discharge from this facility approx 4 hours ago with dx of UTI. WAs given meds including pyridium for burning. Pt states she can not sleep with the pain and cannot get her prescriptions filled until her paycheck comes.

## 2015-11-25 NOTE — Discharge Instructions (Signed)
1. Medications: keflex, pain medicine, pyridium, usual home medications 2. Treatment: rest, drink plenty of fluids 3. Follow Up: please followup with your primary doctor for discussion of your diagnoses and further evaluation after today's visit; if you do not have a primary care doctor use the phone number listed in your discharge paperwork to find one; please return to the ER for new or worsening symptoms   Urinary Tract Infection A urinary tract infection (UTI) can occur any place along the urinary tract. The tract includes the kidneys, ureters, bladder, and urethra. A type of germ called bacteria often causes a UTI. UTIs are often helped with antibiotic medicine.  HOME CARE   If given, take antibiotics as told by your doctor. Finish them even if you start to feel better.  Drink enough fluids to keep your pee (urine) clear or pale yellow.  Avoid tea, drinks with caffeine, and bubbly (carbonated) drinks.  Pee often. Avoid holding your pee in for a long time.  Pee before and after having sex (intercourse).  Wipe from front to back after you poop (bowel movement) if you are a woman. Use each tissue only once. GET HELP RIGHT AWAY IF:   You have back pain.  You have lower belly (abdominal) pain.  You have chills.  You feel sick to your stomach (nauseous).  You throw up (vomit).  Your burning or discomfort with peeing does not go away.  You have a fever.  Your symptoms are not better in 3 days. MAKE SURE YOU:   Understand these instructions.  Will watch your condition.  Will get help right away if you are not doing well or get worse.   This information is not intended to replace advice given to you by your health care provider. Make sure you discuss any questions you have with your health care provider.   Document Released: 01/13/2008 Document Revised: 08/17/2014 Document Reviewed: 02/25/2012 Elsevier Interactive Patient Education [employer]2016 [employer] Inc[employer]

## 2015-11-25 NOTE — ED Provider Notes (Signed)
CSN: [id number]     Arrival date & time 11/25/15  0041 History   First MD Initiated Contact with Patient 11/25/15 0044     Chief Complaint  Patient presents with  . Follow-up  . Urinary Tract Infection    HPI   Jennifer Brown is a 35 y.o. female with a PMH of obesity who presents to the ED with persistent dysuria. She was evaluated in the ED tonight and discharged with keflex, pyridium, and pain medication after being diagnosed with a UTI. She states her symptoms have only improved mildly, prompting her to return to the ED. She denies exacerbating factors. She states she is unable to fill her medications until she gets paid tomorrow. She denies new or worsening symptoms. She denies fever, chills, abdominal pain. She states her nausea is resolved.   Past Medical History  Diagnosis Date  . Hx of gastric bypass 2004  . Morbid obesity (HCC)   . Anemia   . Substance abuse     Tobacco, ETOH  . Urinary tract infection   . Ovarian cyst   . Insomnia   . Depression     no meds x 1 yr   Past Surgical History  Procedure Laterality Date  . Gastric bypass  01/23/03  . Breast surgery  12/25/08    Reduction  . Hernia repair  11/13/04  . Cholecystectomy  08/09/02  . Cesarean section  2001, 2005, 2009  . Induced abortion    . Breast reduction surgery     Family History  Problem Relation Age of Onset  . Other Neg Hx    Social History  Substance Use Topics  . Smoking status: Former Smoker -- 0.25 packs/day for 10 years    Types: Cigarettes    Start date: 09/14/2002  . Smokeless tobacco: Never Used     Comment: very light smoker  . Alcohol Use: No   OB History    Gravida Para Term Preterm AB TAB SAB Ectopic Multiple Living   8 4 4  0 4 3 1  0 0 4       Review of Systems  Constitutional: Negative for fever and chills.  Gastrointestinal: Negative for nausea, vomiting and abdominal pain.  Genitourinary: Positive for dysuria.      Allergies  Review of patient's allergies indicates  no known allergies.  Home Medications   Prior to Admission medications   Medication Sig Start Date End Date Taking? Authorizing Provider  busPIRone (BUSPAR) 15 MG tablet TAKE 5MG  (1/3) TABLET 3 TIMES A DAY AS NEEDED FOR ANXIETY 10/19/14   Historical Provider, MD  cephALEXin (KEFLEX) 500 MG capsule Take 1 capsule (500 mg total) by mouth 4 (four) times daily. 11/24/15   Gwyneth SproutWhitney Plunkett, MD  diphenhydramine-acetaminophen (TYLENOL PM) 25-500 MG TABS Take by mouth.    Historical Provider, MD  folic acid (FOLVITE) 1 MG tablet Take 1 tablet (1 mg total) by mouth daily. 04/12/15   Verdie MosherSarah M [place], NP  HYDROcodone-acetaminophen (NORCO/VICODIN) 5-325 MG tablet Take 1-2 tablets by mouth every 6 (six) hours as needed. 11/24/15   Gwyneth SproutWhitney Plunkett, MD  ibuprofen (ADVIL) 200 MG tablet Take 200 mg by mouth as needed. ADVIL PM FOR SLEEP    Historical Provider, MD  naproxen (NAPROSYN) 500 MG tablet Take 1 tablet (500 mg total) by mouth 2 (two) times daily. 05/07/15   Fayrene HelperBowie Tran, PA-C  phenazopyridine (PYRIDIUM) 200 MG tablet Take 1 tablet (200 mg total) by mouth 3 (three) times daily. 11/24/15   Whitney  Plunkett, MD  sertraline (ZOLOFT) 25 MG tablet TAKE 1 TABLET (25 MG TOTAL) BY MOUTH DAILY. 12/20/14   Historical Provider, MD    BP 155/112 mmHg  Pulse 98  Temp(Src) 98.1 F (36.7 C) (Oral)  Resp 18  SpO2 99%  LMP 11/15/2015 Physical Exam  Constitutional: She is oriented to person, place, and time. She appears well-developed and well-nourished. No distress.  HENT:  Head: Normocephalic and atraumatic.  Right Ear: External ear normal.  Left Ear: External ear normal.  Nose: Nose normal.  Eyes: Conjunctivae and EOM are normal. Right eye exhibits no discharge. Left eye exhibits no discharge. No scleral icterus.  Neck: Normal range of motion. Neck supple.  Cardiovascular: Normal rate and regular rhythm.   Pulmonary/Chest: Effort normal and breath sounds normal. No respiratory distress.  Musculoskeletal: Normal  range of motion. She exhibits no edema or tenderness.  Neurological: She is alert and oriented to person, place, and time.  Skin: Skin is warm and dry. She is not diaphoretic.  Psychiatric: She has a normal mood and affect. Her behavior is normal.  Nursing note and vitals reviewed.   ED Course  Procedures (including critical care time)  Labs Review Labs Reviewed - No data to display  Imaging Review No results found.    EKG Interpretation None      MDM   Final diagnoses:  UTI (lower urinary tract infection)    35 year old female presents with persistent dysuria after being evaluated earlier tonight for a UTI. She denies fever, chills, abdominal pain. She states her nausea is now resolved. She denies new or worsening symptoms. Patient given additional dose of pain medication and yridium in the ED. She is nontoxic and well-appearing, feel she is stable for discharge at this time. Do not feel further evaluation is indicated given no change in symptoms. Patient to fill her prescriptions tomorrow. Patient to follow-up with PCP. Return precautions discussed. Patient verbalizes her understanding and is in agreement with plan.  BP 155/112 mmHg  Pulse 98  Temp(Src) 98.1 F (36.7 C) (Oral)  Resp 18  SpO2 99%  LMP 11/15/2015    Mady Gemma, PA-C 11/25/15 0101  Paula Libra, MD 11/25/15 [phone]

## 2015-11-25 NOTE — ED Notes (Signed)
ED PA at [facility] 

## 2015-11-27 ENCOUNTER — Encounter (HOSPITAL_BASED_OUTPATIENT_CLINIC_OR_DEPARTMENT_OTHER): Payer: Self-pay | Admitting: *Deleted

## 2015-11-27 ENCOUNTER — Emergency Department (HOSPITAL_BASED_OUTPATIENT_CLINIC_OR_DEPARTMENT_OTHER)
Admission: EM | Admit: 2015-11-27 | Discharge: 2015-11-27 | Disposition: A | Discharge status: Home / Self Care | Payer: Managed Care, Other (non HMO) | Attending: Emergency Medicine | Admitting: Emergency Medicine

## 2015-11-27 DIAGNOSIS — N760 Acute vaginitis: Secondary | ICD-10-CM | POA: Insufficient documentation

## 2015-11-27 DIAGNOSIS — Z79899 Other long term (current) drug therapy: Secondary | ICD-10-CM | POA: Insufficient documentation

## 2015-11-27 DIAGNOSIS — Z87891 Personal history of nicotine dependence: Secondary | ICD-10-CM | POA: Insufficient documentation

## 2015-11-27 DIAGNOSIS — F329 Major depressive disorder, single episode, unspecified: Secondary | ICD-10-CM | POA: Insufficient documentation

## 2015-11-27 DIAGNOSIS — R102 Pelvic and perineal pain: Secondary | ICD-10-CM | POA: Diagnosis present

## 2015-11-27 LAB — WET PREP, GENITAL
Sperm: NONE SEEN
Trich, Wet Prep: NONE SEEN
Yeast Wet Prep HPF POC: NONE SEEN

## 2015-11-27 LAB — URINE CULTURE: Culture: >=100000 — AB

## 2015-11-27 LAB — PREGNANCY, URINE: PREG TEST UR: NEGATIVE

## 2015-11-27 MED ORDER — METRONIDAZOLE 500 MG PO TABS: 500.0000 mg | ORAL_TABLET | Freq: Two times a day (BID) | ORAL | Status: DC

## 2015-11-27 MED ORDER — IBUPROFEN 400 MG PO TABS
600.0000 mg | ORAL_TABLET | Freq: Once | ORAL | Status: DC
  Filled 2015-11-27: qty 1

## 2015-11-27 MED ORDER — CEFTRIAXONE SODIUM 250 MG IJ SOLR
250.0000 mg | Freq: Once | INTRAMUSCULAR | Status: AC
  Administered 2015-11-27: 250 mg via INTRAMUSCULAR
  Filled 2015-11-27: qty 250

## 2015-11-27 MED ORDER — AZITHROMYCIN 250 MG PO TABS
1000.0000 mg | ORAL_TABLET | Freq: Once | ORAL | Status: AC
  Administered 2015-11-27: 1000 mg via ORAL
  Filled 2015-11-27: qty 4

## 2015-11-27 MED ORDER — LIDOCAINE HCL (PF) 1 % IJ SOLN
INTRAMUSCULAR | Status: AC
  Administered 2015-11-27: 0.9 mL
  Filled 2015-11-27: qty 5

## 2015-11-27 MED ORDER — ACETAMINOPHEN 325 MG PO TABS
650.0000 mg | ORAL_TABLET | Freq: Once | ORAL | Status: AC
  Administered 2015-11-27: 650 mg via ORAL
  Filled 2015-11-27: qty 2

## 2015-11-27 MED FILL — metroNIDAZOLE 500 MG TABS: 500 | 7 days supply | Qty: 14 | Fill #0

## 2015-11-27 NOTE — ED Notes (Addendum)
Patient states she was seen her three days ago and treated for a uti.  States she continues to have light bleeding with urination and severe pain in the inside of the vaginal wall.  Describes the pain as a stinging burning pain.

## 2015-11-27 NOTE — Discharge Instructions (Signed)

## 2015-11-27 NOTE — ED Provider Notes (Signed)
CSN: [id number]     Arrival date & time 11/27/15  0754 History   First MD Initiated Contact with Patient 11/27/15 0809     Chief Complaint  Patient presents with  . Vaginal Pain     (Consider location/radiation/quality/duration/timing/severity/associated sxs/prior Treatment) HPI  35 year old female presents with vaginal burning. Has been ongoing for over 1 week. Was seen here 3 days ago and diagnosed with a UTI. Has been taking the Pyridium and Keflex. She feels like the burning is still present. There is some burning on urination but it is mostly in her vagina and with her vaginal muscles whenever they contract. Some discharge and has also been having vaginal bleeding. Is having lower abdominal pain. No fevers or vomiting. Nothing seems to help the symptoms.  Past Medical History  Diagnosis Date  . Hx of gastric bypass 2004  . Morbid obesity (HCC)   . Anemia   . Substance abuse     Tobacco, ETOH  . Urinary tract infection   . Ovarian cyst   . Insomnia   . Depression     no meds x 1 yr   Past Surgical History  Procedure Laterality Date  . Gastric bypass  01/23/03  . Breast surgery  12/25/08    Reduction  . Hernia repair  11/13/04  . Cholecystectomy  08/09/02  . Cesarean section  2001, 2005, 2009  . Induced abortion    . Breast reduction surgery     Family History  Problem Relation Age of Onset  . Other Neg Hx    Social History  Substance Use Topics  . Smoking status: Former Smoker -- 0.25 packs/day for 10 years    Types: Cigarettes    Start date: 09/14/2002  . Smokeless tobacco: Never Used     Comment: very light smoker  . Alcohol Use: No   OB History    Gravida Para Term Preterm AB TAB SAB Ectopic Multiple Living   8 4 4  0 4 3 1  0 0 4     Review of Systems  Constitutional: Negative for fever.  Gastrointestinal: Positive for abdominal pain.  Genitourinary: Positive for dysuria, vaginal bleeding, vaginal discharge and vaginal pain.  All other systems reviewed and  are negative.     Allergies  Review of patient's allergies indicates no known allergies.  Home Medications   Prior to Admission medications   Medication Sig Start Date End Date Taking? Authorizing Provider  busPIRone (BUSPAR) 15 MG tablet TAKE 5MG  (1/3) TABLET 3 TIMES A DAY AS NEEDED FOR ANXIETY 10/19/14  Yes Historical Provider, MD  cephALEXin (KEFLEX) 500 MG capsule Take 1 capsule (500 mg total) by mouth 4 (four) times daily. 11/24/15  Yes Gwyneth SproutWhitney Plunkett, MD  folic acid (FOLVITE) 1 MG tablet Take 1 tablet (1 mg total) by mouth daily. 04/12/15  Yes Verdie MosherSarah M [place], NP  naproxen (NAPROSYN) 500 MG tablet Take 1 tablet (500 mg total) by mouth 2 (two) times daily. 05/07/15  Yes Fayrene HelperBowie Tran, PA-C  phenazopyridine (PYRIDIUM) 200 MG tablet Take 1 tablet (200 mg total) by mouth 3 (three) times daily. 11/24/15  Yes Gwyneth SproutWhitney Plunkett, MD  sertraline (ZOLOFT) 25 MG tablet TAKE 1 TABLET (25 MG TOTAL) BY MOUTH DAILY. 12/20/14  Yes Historical Provider, MD  diphenhydramine-acetaminophen (TYLENOL PM) 25-500 MG TABS Take by mouth.    Historical Provider, MD  HYDROcodone-acetaminophen (NORCO/VICODIN) 5-325 MG tablet Take 1-2 tablets by mouth every 6 (six) hours as needed. 11/24/15   Gwyneth SproutWhitney Plunkett, MD  ibuprofen (ADVIL) 200 MG tablet Take 200 mg by mouth as needed. ADVIL PM FOR SLEEP    Historical Provider, MD   LMP 11/15/2015 Physical Exam  Constitutional: She is oriented to person, place, and time. She appears well-developed and well-nourished.  HENT:  Head: Normocephalic and atraumatic.  Right Ear: External ear normal.  Left Ear: External ear normal.  Nose: Nose normal.  Eyes: Right eye exhibits no discharge. Left eye exhibits no discharge.  Cardiovascular: Normal rate, regular rhythm and normal heart sounds.   Pulmonary/Chest: Effort normal and breath sounds normal.  Abdominal: Soft. There is tenderness in the suprapubic area.  Genitourinary: Uterus is not enlarged and not tender. Cervix exhibits  no motion tenderness. Right adnexum displays no tenderness. Left adnexum displays no tenderness. There is tenderness in the vagina. No signs of injury around the vagina. Vaginal discharge (white) found.  Patient is quite tender on inner right vagina. No obvious laceration, injury or other visible or palpable abnormality  Neurological: She is alert and oriented to person, place, and time.  Skin: Skin is warm and dry.  Nursing note and vitals reviewed.   ED Course  Procedures (including critical care time) Labs Review Labs Reviewed  WET PREP, GENITAL - Abnormal; Notable for the following:    Clue Cells Wet Prep HPF POC PRESENT (*)    WBC, Wet Prep HPF POC FEW (*)    All other components within normal limits  PREGNANCY, URINE  GC/CHLAMYDIA PROBE AMP ([place]) NOT AT [facility]    Imaging Review No results found. I have personally reviewed and evaluated these images and lab results as part of my medical decision-making.   EKG Interpretation None      MDM   Final diagnoses:  Vaginitis    Patient's vaginal pain is likely from vaginitis. Given discharge with concomitant clue cells, will treat with Flagyl for BV. Continue Keflex for UTI as the urine culture from 3 days ago shows Escherichia coli that is sensitive to cephalosporins. Likely her vaginal pain is from irritation. Exam not consistent with PID, doubt pelvic emergency. Follow-up with PCP.    Pricilla Loveless, MD 11/27/15 1056

## 2015-11-28 ENCOUNTER — Telehealth: Payer: Self-pay | Admitting: *Deleted

## 2015-11-28 LAB — GC/CHLAMYDIA PROBE AMP (~~LOC~~) NOT AT ARMC
Chlamydia: NEGATIVE
NEISSERIA GONORRHEA: NEGATIVE

## 2015-11-28 NOTE — ED Notes (Signed)
Post ED Visit - Positive Culture Follow-up  Culture report reviewed by antimicrobial stewardship pharmacist:  []  Enzo BiNathan Batchelder, Pharm.D. []  Celedonio MiyamotoJeremy Frens, Pharm.D., BCPS []  Garvin FilaMike Maccia, Pharm.D. []  Georgina PillionElizabeth Martin, Pharm.D., BCPS []  WillardMinh Pham, [address]Pharm[address]D., BCPS, AAHIVP [x]  Estella HuskMichelle Turner, Pharm.D., BCPS, AAHIVP []  Cassie Stewart, Pharm.D. []  Sherle Poeob Vincent, [address]Pharm[address]D.  Positive urine culture Treated with Cephalexin, organism sensitive to the same and no further patient follow-up is required at this time.  Virl AxeRobertson, Brendaly Townsel [facility], [place] 11/28/2015, 11:22 AM

## 2017-02-23 ENCOUNTER — Emergency Department (HOSPITAL_BASED_OUTPATIENT_CLINIC_OR_DEPARTMENT_OTHER)
Admission: EM | Admit: 2017-02-23 | Discharge: 2017-02-23 | Disposition: A | Discharge status: Home / Self Care | Payer: Self-pay | Attending: Emergency Medicine | Admitting: Emergency Medicine

## 2017-02-23 ENCOUNTER — Encounter (HOSPITAL_BASED_OUTPATIENT_CLINIC_OR_DEPARTMENT_OTHER): Payer: Self-pay | Admitting: *Deleted

## 2017-02-23 ENCOUNTER — Emergency Department (HOSPITAL_BASED_OUTPATIENT_CLINIC_OR_DEPARTMENT_OTHER): Payer: Self-pay

## 2017-02-23 DIAGNOSIS — Y929 Unspecified place or not applicable: Secondary | ICD-10-CM | POA: Insufficient documentation

## 2017-02-23 DIAGNOSIS — Y33XXXA Other specified events, undetermined intent, initial encounter: Secondary | ICD-10-CM | POA: Insufficient documentation

## 2017-02-23 DIAGNOSIS — Y9389 Activity, other specified: Secondary | ICD-10-CM | POA: Insufficient documentation

## 2017-02-23 DIAGNOSIS — Y999 Unspecified external cause status: Secondary | ICD-10-CM | POA: Insufficient documentation

## 2017-02-23 DIAGNOSIS — Z79899 Other long term (current) drug therapy: Secondary | ICD-10-CM | POA: Insufficient documentation

## 2017-02-23 DIAGNOSIS — Z87891 Personal history of nicotine dependence: Secondary | ICD-10-CM | POA: Insufficient documentation

## 2017-02-23 DIAGNOSIS — S62647A Nondisplaced fracture of proximal phalanx of left little finger, initial encounter for closed fracture: Secondary | ICD-10-CM

## 2017-02-23 MED ORDER — HYDROCODONE-ACETAMINOPHEN 5-325 MG PO TABS
1.0000 | ORAL_TABLET | Freq: Once | ORAL | Status: AC
  Administered 2017-02-23: 1 via ORAL
  Filled 2017-02-23: qty 1

## 2017-02-23 NOTE — ED Notes (Signed)
Pt needing to leave urgently because her ride was going to leave her. D/C instructions reviewed

## 2017-02-23 NOTE — Discharge Instructions (Signed)
Alternate 600 mg of ibuprofen and [phone] mg of Tylenol every 3 hours as needed for pain. Do not exceed 4000 mg of Tylenol daily. Apply ice or heat to the area for comfort. Dr. Carollee Massedhompson's office will contact you to make a follow-up appointment tomorrow, however if they do not you should give them a call to make a follow-up appointment. Return to the ED immediately if any concerning signs or symptoms develop.

## 2017-02-23 NOTE — ED Provider Notes (Signed)
[facility]   CSN: [id number] Arrival date & time: 02/23/17  1324     History   Chief Complaint Chief Complaint  Patient presents with  . Hand Injury    HPI Jennifer Brown is a 36 y.o. female who presents today with chief complaint acute onset, constant left hand pain. Patient states she awoke this morning with swelling, bruising, and pain to the hand. She denies any known injury, but states that she was drinking wine last night and may have fallen. Denies head injury or loss of consciousness. Pain is worsened with movement and palpation. Has not tried anything for her symptoms. No alleviating factors noted. Denies numbness, tingling, or weakness.   The history is provided by the patient.    Past Medical History:  Diagnosis Date  . Anemia   . Depression    no meds x 1 yr  . Hx of gastric bypass 2004  . Insomnia   . Morbid obesity (HCC)   . Ovarian cyst   . Substance abuse    Tobacco, ETOH  . Urinary tract infection     Patient Active Problem List   Diagnosis Date Noted  . Abnormal uterine bleeding 09/30/2014  . Abdominal pain, epigastric 05/01/2014  . Chronic LBP 01/11/2014  . Anxiety 06/15/2013  . Dysmenorrhea 06/15/2013  . Blood pressure elevated 06/15/2013  . Anemia, iron deficiency 06/15/2013  . Headache, migraine 06/15/2013  . H/O gastric bypass 11/27/2012  . Hx of cesarean section 03/11/2012  . Iron deficiency 03/07/2012    Past Surgical History:  Procedure Laterality Date  . BREAST REDUCTION SURGERY    . BREAST SURGERY  12/25/08   Reduction  . CESAREAN SECTION  2001, 2005, 2009  . CHOLECYSTECTOMY  08/09/02  . GASTRIC BYPASS  01/23/03  . HERNIA REPAIR  11/13/04  . INDUCED ABORTION      OB History    Gravida Para Term Preterm AB Living   8 4 4  0 4 4   SAB TAB Ectopic Multiple Live Births   1 3 0 0 3       Home Medications    Prior to Admission medications   Medication Sig Start Date End Date Taking? Authorizing  Provider  busPIRone (BUSPAR) 15 MG tablet TAKE 5MG  (1/3) TABLET 3 TIMES A DAY AS NEEDED FOR ANXIETY 10/19/14   [provider]  cephALEXin (KEFLEX) 500 MG capsule Take 1 capsule (500 mg total) by mouth 4 (four) times daily. 11/24/15   Gwyneth Sprout, MD  diphenhydramine-acetaminophen (TYLENOL PM) 25-500 MG TABS Take by mouth.    [provider]  folic acid (FOLVITE) 1 MG tablet Take 1 tablet (1 mg total) by mouth daily. 04/12/15   [place], Brand Males, NP  HYDROcodone-acetaminophen (NORCO/VICODIN) 5-325 MG tablet Take 1-2 tablets by mouth every 6 (six) hours as needed. 11/24/15   Gwyneth Sprout, MD  ibuprofen (ADVIL) 200 MG tablet Take 200 mg by mouth as needed. ADVIL PM FOR SLEEP    [provider]  metroNIDAZOLE (FLAGYL) 500 MG tablet Take 1 tablet (500 mg total) by mouth 2 (two) times daily. One po bid x 7 days 11/27/15   Pricilla Loveless, MD  naproxen (NAPROSYN) 500 MG tablet Take 1 tablet (500 mg total) by mouth 2 (two) times daily. 05/07/15   Fayrene Helper, PA-C  phenazopyridine (PYRIDIUM) 200 MG tablet Take 1 tablet (200 mg total) by mouth 3 (three) times daily. 11/24/15   Gwyneth Sprout, MD  sertraline (ZOLOFT) 25 MG  tablet TAKE 1 TABLET (25 MG TOTAL) BY MOUTH DAILY. 12/20/14   [provider]    Family History Family History  Problem Relation Age of Onset  . Other Neg Hx     Social History Social History  Substance Use Topics  . Smoking status: Former Smoker    Packs/day: 0.25    Years: 10.00    Types: Cigarettes    Start date: 09/14/2002  . Smokeless tobacco: Never Used     Comment: very light smoker  . Alcohol use No     Allergies   Patient has no known allergies.   Review of Systems Review of Systems  Musculoskeletal: Positive for arthralgias and joint swelling. Negative for back pain and neck pain.  Skin: Positive for color change (bruising to hand).  Neurological: Negative for syncope, weakness, numbness and headaches.      Physical Exam Updated Vital Signs BP (!) 133/106   Pulse 78   Temp 98.6 F (37 C) (Oral)   Resp 20   Ht 5\' 1"  (1.549 m)   Wt 87.5 kg (192 lb 14.4 oz)   LMP 01/29/2017   SpO2 100%   BMI 36.45 kg/m   Physical Exam  Constitutional: She appears well-developed and well-nourished. No distress.  HENT:  Head: Normocephalic and atraumatic.  Eyes: Conjunctivae are normal. Right eye exhibits no discharge. Left eye exhibits no discharge.  Neck: No JVD present. No tracheal deviation present.  Cardiovascular: Normal rate and intact distal pulses.   2+ radial pulses bilaterally  Pulmonary/Chest: Effort normal.  Abdominal: She exhibits no distension.  Musculoskeletal: She exhibits edema and tenderness.  Decreased range of motion of the left fourth and fifth digits due to pain. There is ecchymosis and swelling overlying the MCP joints of the fourth and fifth digits. No deformity, erythema, or crepitus noted. Maximally tender to palpation overlying the fifth proximal phalanx. 5/5 strength of the digits with flexion and extension against resistance, however painful. Good grip strength. No snuffbox tenderness.  Neurological: She is alert.  Fluent speech, no facial droop, sensation intact to soft touch of bilateral hands and digits  Skin: Skin is warm and dry. Capillary refill takes less than 2 seconds. No erythema.  Psychiatric: She has a normal mood and affect. Her behavior is normal.  Nursing note and vitals reviewed.    ED Treatments / Results  Labs (all labs ordered are listed, but only abnormal results are displayed) Labs Reviewed - No data to display  EKG  EKG Interpretation None       Radiology Dg Hand Complete Left  Result Date: 02/23/2017 CLINICAL DATA:  Pain and bruising EXAM: LEFT HAND - COMPLETE 3+ VIEW COMPARISON:  None. FINDINGS: Frontal, oblique, and lateral views were obtained. There is a transversely oriented fracture of the proximal metaphysis of the fifth  proximal phalanx with mild impaction at the fracture site. There is slight medial angulation distally in this area. No other fracture. No dislocation. Joint spaces appear normal. No erosive change. IMPRESSION: Fracture, proximal aspect fifth proximal phalanx with medial angulation distally at the fracture site. No other fracture. No dislocation. No appreciable arthropathic change. Electronically Signed   By: Bretta BangWilliam  Woodruff III M.D.   On: 02/23/2017 13:49    Procedures Procedures (including critical care time)  Medications Ordered in ED Medications  HYDROcodone-acetaminophen (NORCO/VICODIN) 5-325 MG per tablet 1 tablet (1 tablet Oral Given 02/23/17 1426)     Initial Impression / Assessment and Plan / ED Course  I have reviewed  the triage vital signs and the nursing notes.  Pertinent labs & imaging results that were available during my care of the patient were reviewed by me and considered in my medical decision making (see chart for details).     Patient with left lateral hand pain. Afebrile, vital signs are stable. X-rays reviewed by me show a fracture to the proximal aspect of the fifth proximal phalanx with medial angulation distally. She is neurovascularly intact with good strength. Will apply finger splint and buddy tape. Spoke with hand surgeon Dr. Janee Morn who agrees with plan, and states that his office will call the patient tomorrow to establish follow-up. Pain has been managed while in the ED. Discussed indications for return to the ED. Pt verbalized understanding of and agreement with plan and is safe for discharge home at this time.  Final Clinical Impressions(s) / ED Diagnoses   Final diagnoses:  Closed nondisplaced fracture of proximal phalanx of left little finger, initial encounter    New Prescriptions Discharge Medication List as of 02/23/2017  2:41 PM       Jeanie Sewer, PA-C 02/23/17 1547    Geoffery Lyons, MD 02/25/17 0221

## 2017-02-23 NOTE — ED Triage Notes (Signed)
Pain in her left hand yesterday while at work. Woke with swelling this am.

## 2017-03-24 ENCOUNTER — Emergency Department (HOSPITAL_BASED_OUTPATIENT_CLINIC_OR_DEPARTMENT_OTHER)
Admission: EM | Admit: 2017-03-24 | Discharge: 2017-03-24 | Disposition: A | Discharge status: Home / Self Care | Payer: No Typology Code available for payment source | Attending: Emergency Medicine | Admitting: Emergency Medicine

## 2017-03-24 ENCOUNTER — Encounter (HOSPITAL_BASED_OUTPATIENT_CLINIC_OR_DEPARTMENT_OTHER): Payer: Self-pay

## 2017-03-24 ENCOUNTER — Emergency Department (HOSPITAL_BASED_OUTPATIENT_CLINIC_OR_DEPARTMENT_OTHER): Payer: No Typology Code available for payment source

## 2017-03-24 DIAGNOSIS — Y92410 Unspecified street and highway as the place of occurrence of the external cause: Secondary | ICD-10-CM | POA: Insufficient documentation

## 2017-03-24 DIAGNOSIS — Y999 Unspecified external cause status: Secondary | ICD-10-CM | POA: Insufficient documentation

## 2017-03-24 DIAGNOSIS — Z87891 Personal history of nicotine dependence: Secondary | ICD-10-CM | POA: Diagnosis not present

## 2017-03-24 DIAGNOSIS — S72111A Displaced fracture of greater trochanter of right femur, initial encounter for closed fracture: Secondary | ICD-10-CM | POA: Diagnosis not present

## 2017-03-24 DIAGNOSIS — Z79899 Other long term (current) drug therapy: Secondary | ICD-10-CM | POA: Diagnosis not present

## 2017-03-24 DIAGNOSIS — Y9301 Activity, walking, marching and hiking: Secondary | ICD-10-CM | POA: Diagnosis not present

## 2017-03-24 DIAGNOSIS — S72001A Fracture of unspecified part of neck of right femur, initial encounter for closed fracture: Secondary | ICD-10-CM

## 2017-03-24 DIAGNOSIS — S79911A Unspecified injury of right hip, initial encounter: Secondary | ICD-10-CM | POA: Diagnosis present

## 2017-03-24 MED ORDER — HYDROCODONE-ACETAMINOPHEN 5-325 MG PO TABS
2.0000 | ORAL_TABLET | Freq: Once | ORAL | Status: AC
  Administered 2017-03-24: 2 via ORAL
  Filled 2017-03-24: qty 2

## 2017-03-24 MED ORDER — HYDROCODONE-ACETAMINOPHEN 5-325 MG PO TABS: 1.0000 | ORAL_TABLET | Freq: Four times a day (QID) | ORAL | 0 refills | Status: DC | PRN

## 2017-03-24 MED FILL — HYDROCODON-APAP 5-325: 5-325 | 3 days supply | Qty: 10 | Fill #0

## 2017-03-24 NOTE — ED Notes (Signed)
Pt returned from CT °

## 2017-03-24 NOTE — ED Notes (Signed)
ED Provider at bedside. 
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
## 2017-03-24 NOTE — Discharge Instructions (Signed)
Please see the Orthopedics doctor as requested. Non weight bearing until cleared.
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
## 2017-04-07 ENCOUNTER — Emergency Department (HOSPITAL_BASED_OUTPATIENT_CLINIC_OR_DEPARTMENT_OTHER)
Admission: EM | Admit: 2017-04-07 | Discharge: 2017-04-07 | Disposition: A | Discharge status: Home / Self Care | Payer: Managed Care, Other (non HMO) | Attending: Emergency Medicine | Admitting: Emergency Medicine

## 2017-04-07 ENCOUNTER — Encounter (HOSPITAL_BASED_OUTPATIENT_CLINIC_OR_DEPARTMENT_OTHER): Payer: Self-pay | Admitting: Emergency Medicine

## 2017-04-07 DIAGNOSIS — Z79899 Other long term (current) drug therapy: Secondary | ICD-10-CM | POA: Insufficient documentation

## 2017-04-07 DIAGNOSIS — F17210 Nicotine dependence, cigarettes, uncomplicated: Secondary | ICD-10-CM | POA: Insufficient documentation

## 2017-04-07 DIAGNOSIS — M25551 Pain in right hip: Secondary | ICD-10-CM

## 2017-04-07 DIAGNOSIS — S72111D Displaced fracture of greater trochanter of right femur, subsequent encounter for closed fracture with routine healing: Secondary | ICD-10-CM

## 2017-04-07 DIAGNOSIS — S72111A Displaced fracture of greater trochanter of right femur, initial encounter for closed fracture: Secondary | ICD-10-CM | POA: Insufficient documentation

## 2017-04-07 DIAGNOSIS — Y929 Unspecified place or not applicable: Secondary | ICD-10-CM | POA: Insufficient documentation

## 2017-04-07 DIAGNOSIS — Y939 Activity, unspecified: Secondary | ICD-10-CM | POA: Insufficient documentation

## 2017-04-07 DIAGNOSIS — Y999 Unspecified external cause status: Secondary | ICD-10-CM | POA: Insufficient documentation

## 2017-04-07 MED ORDER — HYDROCODONE-ACETAMINOPHEN 5-325 MG PO TABS
2.0000 | ORAL_TABLET | Freq: Once | ORAL | Status: AC
  Administered 2017-04-07: 2 via ORAL
  Filled 2017-04-07: qty 2

## 2017-04-07 NOTE — ED Notes (Signed)
ED Provider at bedside. 
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
## 2017-04-07 NOTE — Discharge Instructions (Signed)
PLEASE FOLLOW UP AS SCHEDULED WITH ORTHOPEDICS IN 2 DAYS. IT IS IMPORTANT TO BE STRICTLY NON-WEIGHT BEARING ON YOUR RIGHT LEG UNTIL THEY SEE YOU. USE CRUTCHES. YOU MAY ALTERNATE TYLENOL AND IBUPROFEN FOR PAIN.

## 2018-03-08 ENCOUNTER — Other Ambulatory Visit: Payer: Self-pay | Admitting: Family

## 2018-03-08 ENCOUNTER — Inpatient Hospital Stay: Payer: Medicaid Other

## 2018-03-08 ENCOUNTER — Inpatient Hospital Stay: Payer: Medicaid Other | Admitting: Family

## 2018-03-08 DIAGNOSIS — D500 Iron deficiency anemia secondary to blood loss (chronic): Secondary | ICD-10-CM

## 2018-03-09 ENCOUNTER — Other Ambulatory Visit: Payer: Self-pay

## 2018-03-09 ENCOUNTER — Encounter: Payer: Self-pay | Admitting: Physical Therapy

## 2018-03-09 ENCOUNTER — Ambulatory Visit: Payer: Medicaid Other | Attending: Physician Assistant | Admitting: Physical Therapy

## 2018-03-09 DIAGNOSIS — R262 Difficulty in walking, not elsewhere classified: Secondary | ICD-10-CM

## 2018-03-09 DIAGNOSIS — M25651 Stiffness of right hip, not elsewhere classified: Secondary | ICD-10-CM

## 2018-03-09 DIAGNOSIS — M6281 Muscle weakness (generalized): Secondary | ICD-10-CM

## 2018-03-09 DIAGNOSIS — M25551 Pain in right hip: Secondary | ICD-10-CM | POA: Diagnosis present
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
## 2018-03-10 NOTE — Addendum Note (Signed)
Addended by: Anette GuarneriKOVALENKO, Akhilesh Sassone D on: 03/10/2018 11:54 AM   Modules accepted: Orders

## 2018-03-16 ENCOUNTER — Other Ambulatory Visit: Payer: Self-pay

## 2018-03-16 ENCOUNTER — Inpatient Hospital Stay: Payer: Medicaid Other

## 2018-03-16 ENCOUNTER — Telehealth: Payer: Self-pay | Admitting: *Deleted

## 2018-03-16 ENCOUNTER — Inpatient Hospital Stay (HOSPITAL_BASED_OUTPATIENT_CLINIC_OR_DEPARTMENT_OTHER): Payer: Medicaid Other | Admitting: Family

## 2018-03-16 ENCOUNTER — Encounter: Payer: Self-pay | Admitting: Family

## 2018-03-16 ENCOUNTER — Inpatient Hospital Stay: Payer: Medicaid Other | Attending: Family

## 2018-03-16 VITALS — BP 107/44 | HR 98 | Temp 98.0°F | Resp 19 | Wt 175.5 lb

## 2018-03-16 DIAGNOSIS — Z720 Tobacco use: Secondary | ICD-10-CM | POA: Insufficient documentation

## 2018-03-16 DIAGNOSIS — N920 Excessive and frequent menstruation with regular cycle: Secondary | ICD-10-CM | POA: Insufficient documentation

## 2018-03-16 DIAGNOSIS — D509 Iron deficiency anemia, unspecified: Secondary | ICD-10-CM | POA: Diagnosis present

## 2018-03-16 DIAGNOSIS — D500 Iron deficiency anemia secondary to blood loss (chronic): Secondary | ICD-10-CM

## 2018-03-16 DIAGNOSIS — Z9884 Bariatric surgery status: Secondary | ICD-10-CM

## 2018-03-16 DIAGNOSIS — K909 Intestinal malabsorption, unspecified: Secondary | ICD-10-CM | POA: Diagnosis not present

## 2018-03-16 DIAGNOSIS — N946 Dysmenorrhea, unspecified: Secondary | ICD-10-CM

## 2018-03-16 LAB — RETICULOCYTES
RBC.: 3.91 MIL/uL (ref 3.70–5.45)
RETIC CT PCT: 1.1 % (ref 0.7–2.1)
Retic Count, Absolute: 43 10*3/uL (ref 33.7–90.7)

## 2018-03-16 LAB — CBC WITH DIFFERENTIAL (CANCER CENTER ONLY)
Basophils Absolute: 0 10*3/uL (ref 0.0–0.1)
Basophils Relative: 1 %
EOS ABS: 0 10*3/uL (ref 0.0–0.5)
EOS PCT: 1 %
HCT: 24.1 % — ABNORMAL LOW (ref 34.8–46.6)
Hemoglobin: 6.4 g/dL — CL (ref 11.6–15.9)
LYMPHS ABS: 2.2 10*3/uL (ref 0.9–3.3)
Lymphocytes Relative: 59 %
MCH: 16.2 pg — AB (ref 26.0–34.0)
MCHC: 26.6 g/dL — ABNORMAL LOW (ref 32.0–36.0)
MCV: 61.2 fL — ABNORMAL LOW (ref 81.0–101.0)
MONO ABS: 0.3 10*3/uL (ref 0.1–0.9)
Monocytes Relative: 9 %
Neutro Abs: 1.1 10*3/uL — ABNORMAL LOW (ref 1.5–6.5)
Neutrophils Relative %: 30 %
PLATELETS: 220 10*3/uL (ref 145–400)
RBC: 3.94 MIL/uL (ref 3.70–5.32)
RDW: 21.9 % — AB (ref 11.1–15.7)
WBC Count: 3.7 10*3/uL — ABNORMAL LOW (ref 3.9–10.0)

## 2018-03-16 MED ORDER — FERUMOXYTOL INJECTION 510 MG/17 ML
510.0000 mg | Freq: Once | INTRAVENOUS | Status: AC
  Administered 2018-03-16: 510 mg via INTRAVENOUS
  Filled 2018-03-16: qty 17

## 2018-03-16 MED ORDER — SODIUM CHLORIDE 0.9 % IV SOLN
Freq: Once | INTRAVENOUS | Status: AC
  Administered 2018-03-16: 15:00:00 via INTRAVENOUS
  Filled 2018-03-16: qty 250

## 2018-03-16 NOTE — Patient Instructions (Signed)
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
## 2018-03-16 NOTE — Progress Notes (Signed)
Patient came to the infusion floor agitated and complaining of "being here for so long".  When I started her IV I smelled alcohol in her breath. Patient even stated at one point that she's been drinking. I explained to her that the iron infusion will take 15 min and that we monitor patients 30 minutes after the iron infusion and she verbalized understanding. While we were waiting for her iron to come out from the pharmacy she became increasingly agitated, loud and beligerent. Gladis RiffleMaggie Morris, our unit manager came and talk to her. Patient agree to her iron infusion and she fell asleep. She suddenly woke up during the 30 min observation and she intentionally pulled her IV out stating that she has " a major in nursing".

## 2018-03-16 NOTE — Telephone Encounter (Signed)
Critical Value Hgb 6.4 Sarah [place] NP notified. Orders will be placed 

## 2018-03-17 ENCOUNTER — Ambulatory Visit: Payer: Medicaid Other | Attending: Physician Assistant

## 2018-03-17 DIAGNOSIS — M25551 Pain in right hip: Secondary | ICD-10-CM | POA: Insufficient documentation

## 2018-03-17 DIAGNOSIS — M25651 Stiffness of right hip, not elsewhere classified: Secondary | ICD-10-CM | POA: Insufficient documentation

## 2018-03-17 DIAGNOSIS — R262 Difficulty in walking, not elsewhere classified: Secondary | ICD-10-CM | POA: Insufficient documentation

## 2018-03-17 DIAGNOSIS — M6281 Muscle weakness (generalized): Secondary | ICD-10-CM | POA: Insufficient documentation

## 2018-03-17 LAB — IRON AND TIBC
Iron: 7 ug/dL — ABNORMAL LOW (ref 41–142)
SATURATION RATIOS: 1 % — AB (ref 21–57)
TIBC: 499 ug/dL — AB (ref 236–444)
UIBC: 492 ug/dL

## 2018-03-17 LAB — FERRITIN: Ferritin: <4 ng/mL — ABNORMAL LOW (ref 11–307)

## 2018-03-17 NOTE — Therapy (Addendum)
[facility] [address]  [address] [place], [place], [address] Phone: [phone]   Fax:  [phone]  Physical Therapy Treatment  Patient Details  Name: Jennifer Brown MRN: [id number] Date of Birth: [birth year] Referring Provider: Yvonne Kendall, PA   Encounter Date: 03/17/2018  PT End of Session - 03/17/18 1706    Visit Number  2    Number of Visits  9    Date for PT Re-Evaluation  03/30/18    Authorization Type  Medicaid    Authorization Time Period  3 visits 8.8.19 - 8.28.19    Authorization - Visit Number  1    Authorization - Number of Visits  3    PT Start Time  1700   Pt. reporting she needs to leave early today due to ride issues.   PT Stop Time  1730    PT Time Calculation (min)  30 min    Activity Tolerance  Patient tolerated treatment well    Behavior During Therapy  WFL for tasks assessed/performed       Past Medical History:  Diagnosis Date  . Anemia   . Depression    no meds x 1 yr  . Hx of gastric bypass 2004  . Insomnia   . Morbid obesity (HCC)   . Ovarian cyst   . Substance abuse (HCC)    Tobacco, ETOH  . Urinary tract infection     Past Surgical History:  Procedure Laterality Date  . BREAST REDUCTION SURGERY    . BREAST SURGERY  12/25/08   Reduction  . CESAREAN SECTION  2001, 2005, 2009  . CHOLECYSTECTOMY  08/09/02  . GASTRIC BYPASS  01/23/03  . HERNIA REPAIR  11/13/04  . INDUCED ABORTION      There were no vitals filed for this visit.  Subjective Assessment - 03/17/18 1715    Subjective  Pt. noting she has had no issues with HEP.      Diagnostic tests  02/18/18 R hip x ray: Healed right greater trochanteric fracture with surrounding heterotopic ossification. Anatomic alignment and position    Patient Stated Goals  be able to manage pain and tools for comfort and relief    Currently in Pain?  Yes    Pain Score  4     Pain Location  Hip    Pain Orientation  Right    Pain Type   Chronic pain    Multiple Pain Sites  No                       [facility] Adult PT Treatment/Exercise - 03/17/18 1713      Knee/Hip Exercises: Stretches   Piriformis Stretch  Right;2 reps;30 seconds    Piriformis Stretch Limitations  KTOS      Knee/Hip Exercises: Standing   Hip Abduction  Right;Left;Stengthening;10 reps;Knee straight    Abduction Limitations  at chair     Hip Extension  Right;Left;10 reps;Knee straight;Stengthening    Extension Limitations  at chair       Knee/Hip Exercises: Supine   Bridges with [employer]  Both;Strengthening;15 reps      Manual Therapy   Manual Therapy  Soft tissue mobilization;Passive ROM    Manual therapy comments  supine     Soft tissue mobilization  STM R piriformis     Passive ROM  R pififormis stretch x 30 sec  PT Education - 03/17/18 1735    Person(s) Educated  Patient    Methods  Explanation;Demonstration;Verbal cues;Handout    Comprehension  Verbalized understanding;Returned demonstration;Verbal cues required;Need further instruction       PT Short Term Goals - 03/17/18 1708      PT SHORT TERM GOAL #1   Title  Patient to be independent with initial HEP.    Time  2    Period  Weeks    Status  Achieved        PT Long Term Goals - 03/17/18 1708      PT LONG TERM GOAL #1   Title  Patient to be independent with advanced HEP.    Time  8    Period  Weeks    Status  On-going      PT LONG TERM GOAL #2   Title  Patient to demonstrate R hip AROM without pain limiting.     Time  8    Period  Weeks    Status  On-going      PT LONG TERM GOAL #3   Title  Patient to demonstrate B LE strength >=4+/5.    Time  8    Period  Weeks    Status  On-going      PT LONG TERM GOAL #4   Title  Patient to tolerate walking for 1 hour without pain limiting.     Time  8    Period  Weeks    Status  On-going      PT LONG TERM GOAL #5   Title  Patient to report tolerance of sitting in car for 2 hours  without pain limiting.    Time  8    Period  Weeks    Status  On-going            Plan - 03/17/18 1708    Clinical Impression Statement  Jennifer Brown reporting she needs to leave early from session today due to ride issues thus session time limited.  Tolerated all LE strengthening activities well today focused on proximal hip strengthening.  Addressed ongoing R glute tenderness/tightness with manual STM and LE stretching with good relief.  Encouraged pt. to perform self-ball release with tennis ball on wall for further relief from soreness.  Ended session pain free.      PT Treatment/Interventions  ADLs/Self Care Home Management;Cryotherapy;Electrical Stimulation;Iontophoresis 4mg /ml Dexamethasone;Moist Heat;Ultrasound;Gait training;Stair training;Functional mobility training;Therapeutic activities;Therapeutic exercise;Manual techniques;Patient/family education;Neuromuscular re-education;Balance training;Passive range of motion;Dry needling;Energy conservation;Splinting;Taping;Vasopneumatic Device    Consulted and Agree with Plan of Care  Patient       Patient will benefit from skilled therapeutic intervention in order to improve the following deficits and impairments:  Hypomobility, Decreased activity tolerance, Decreased strength, Pain, Decreased mobility, Difficulty walking, Decreased range of motion, Postural dysfunction  Visit Diagnosis: Pain in right hip  Stiffness of right hip, not elsewhere classified  Muscle weakness (generalized)  Difficulty in walking, not elsewhere classified     Problem List Patient Active Problem List   Diagnosis Date Noted  . Abnormal uterine bleeding 09/30/2014  . Abdominal pain, epigastric 05/01/2014  . Chronic LBP 01/11/2014  . Anxiety 06/15/2013  . Dysmenorrhea 06/15/2013  . Blood pressure elevated 06/15/2013  . Anemia, iron deficiency 06/15/2013  . Headache, migraine 06/15/2013  . H/O gastric bypass 11/27/2012  . Hx of cesarean section  03/11/2012  . Iron deficiency 03/07/2012    Kermit Balo, PTA 03/17/18 6:22 PM   [facility] [address]  [address] [place], [place], [address] Phone: [phone]   Fax:  [phone]  Name: Jennifer Brown MRN: [id number] Date of Birth: 1980/09/01

## 2018-03-22 ENCOUNTER — Ambulatory Visit: Payer: Medicaid Other

## 2018-03-23 ENCOUNTER — Ambulatory Visit: Payer: Self-pay

## 2018-03-29 ENCOUNTER — Encounter: Payer: Self-pay | Admitting: *Deleted

## 2018-03-29 ENCOUNTER — Ambulatory Visit: Payer: Medicaid Other | Admitting: Physical Therapy

## 2018-03-29 DIAGNOSIS — M6281 Muscle weakness (generalized): Secondary | ICD-10-CM

## 2018-03-29 DIAGNOSIS — M25651 Stiffness of right hip, not elsewhere classified: Secondary | ICD-10-CM

## 2018-03-29 DIAGNOSIS — M25551 Pain in right hip: Secondary | ICD-10-CM

## 2018-03-29 DIAGNOSIS — R262 Difficulty in walking, not elsewhere classified: Secondary | ICD-10-CM

## 2018-03-29 NOTE — Addendum Note (Signed)
Addended by: Anette GuarneriKOVALENKO, Yanel Dombrosky D on: 03/29/2018 12:22 PM   Modules accepted: Orders

## 2018-03-29 NOTE — Therapy (Addendum)
[facility] [address]  [place], [place], [address] Phone: [phone]   Fax:  [phone]  Physical Therapy Treatment  Patient Details  Name: Jennifer Brown MRN: [id number] Date of Birth: [birth year] Referring Provider: Renaldo Reel, PA   Encounter Date: 03/29/2018  PT End of Session - 03/29/18 1108    Visit Number  3    Number of Visits  15    Date for PT Re-Evaluation  05/10/18    Authorization Type  Medicaid    Authorization Time Period  3 visits 8.8.19 - 8.28.19    Authorization - Visit Number  2    Authorization - Number of Visits  3    PT Start Time  8110    PT Stop Time  1058    PT Time Calculation (min)  40 min    Activity Tolerance  Patient tolerated treatment well    Behavior During Therapy  [facility] for tasks assessed/performed       Past Medical History:  Diagnosis Date  . Anemia   . Depression    no meds x 1 yr  . Hx of gastric bypass 2004  . Insomnia   . Morbid obesity ([place])   . Ovarian cyst   . Substance abuse ([facility])    Tobacco, ETOH  . Urinary tract infection     Past Surgical History:  Procedure Laterality Date  . BREAST REDUCTION SURGERY    . BREAST SURGERY  12/25/08   Reduction  . CESAREAN SECTION  2001, 2005, 2009  . CHOLECYSTECTOMY  08/09/02  . GASTRIC BYPASS  01/23/03  . HERNIA REPAIR  11/13/04  . INDUCED ABORTION      There were no vitals filed for this visit.  Subjective Assessment - 03/29/18 1020    Subjective  Patient reports she has a hoarse voice today. Reports no change in symptoms since iniital eval. Reports "pretty much consistent with HEP." But limited by spams. Patient would like to try e-stim. Patient still notes difficulty with squatting, sitting, getting out of the car.     Diagnostic tests  02/18/18 R hip x ray: Healed right greater trochanteric fracture with surrounding heterotopic ossification. Anatomic alignment and position    Patient Stated Goals  be  able to manage pain and tools for comfort and relief    Currently in Pain?  Yes    Pain Score  7     Pain Location  Hip    Pain Orientation  Right    Pain Descriptors / Indicators  Discomfort    Pain Type  Chronic pain         [facility] PT Assessment - 03/29/18 0001      AROM   Right Hip Flexion  85    Right Hip External Rotation   38    Right Hip Internal Rotation   4   pain in R quad   Right Hip ABduction  28   groin pain     Strength   Right/Left Hip  Right;Left    Right Hip Flexion  4-/5    Right Hip ABduction  4/5    Right Hip ADduction  4-/5    Left Hip Flexion  4+/5    Left Hip ABduction  4+/5    Left Hip ADduction  4+/5    Right/Left Knee  Right;Left    Right Knee Flexion  3+/5    Right Knee Extension  3+/5   pain in R  buttock   Left Knee Flexion  4+/5    Left Knee Extension  4/5    Right/Left Ankle  Right;Left    Right Ankle Dorsiflexion  4-/5    Right Ankle Plantar Flexion  4-/5    Left Ankle Dorsiflexion  4/5    Left Ankle Plantar Flexion  4+/5                   [facility] Adult PT Treatment/Exercise - 03/29/18 0001      Exercises   Exercises  Knee/Hip      Knee/Hip Exercises: Stretches   Hip Flexor Stretch  Right;2 reps;20 seconds;Limitations    Hip Flexor Stretch Limitations  mod thomas stretch with strap    Piriformis Stretch  Right;1 rep;20 seconds    Piriformis Stretch Limitations  KTOS    Other Knee/Hip Stretches  figure 4 stretch 2x20"      Knee/Hip Exercises: Aerobic   Stationary Bike  L 1 x 6 min      Knee/Hip Exercises: Standing   Heel Raises  Both;1 set;15 reps;Limitations    Heel Raises Limitations  B heel-toe raise at chair    Other Standing Knee Exercises  B heel-toe raise with chair support       Knee/Hip Exercises: Supine   Bridges with Clamshell  Strengthening;Both;2 sets;10 reps;Limitations   red TB around knees     Knee/Hip Exercises: Sidelying   Clams  10x each side with red TB around knees   correction of form              PT Education - 03/29/18 1107    Education Details  update to HEP    Person(s) Educated  Patient    Methods  Explanation;Demonstration;Tactile cues;Verbal cues;Handout    Comprehension  Returned demonstration;Verbalized understanding       PT Short Term Goals - 03/29/18 1023      PT SHORT TERM GOAL #1   Title  Patient to be independent with initial HEP.    Time  2    Period  Weeks    Status  Achieved        PT Long Term Goals - 03/29/18 1024      PT LONG TERM GOAL #1   Title  Patient to be independent with advanced HEP.    Time  6    Period  Weeks    Status  Partially Met   met for current   Target Date  05/10/18      PT LONG TERM GOAL #2   Title  Patient to demonstrate R hip AROM without pain limiting.     Time  6    Period  Weeks    Status  On-going   improvement in pain levels demonstrated in R hip flexion and ER; ROM improvement noted in ER   Target Date  05/10/18      PT LONG TERM GOAL #3   Title  Patient to demonstrate B LE strength >=4+/5.    Time  6    Period  Weeks    Status  On-going   strength unchanged   Target Date  05/10/18      PT LONG TERM GOAL #4   Title  Patient to tolerate walking for 1 hour without pain limiting.     Time  6    Period  Weeks    Status  On-going   reports 15 min of walking   Target Date  05/10/18  PT LONG TERM GOAL #5   Title  Patient to report tolerance of sitting in car for 2 hours without pain limiting.    Time  6    Period  Weeks    Status  On-going   reports 5 min of sitting tolerance before having to shift d/t pain   Target Date  05/10/18            Plan - 03/29/18 1109    Clinical Impression Statement  Patient arrived to session with no new complaints, however notes no improvement since initial eval. Reports that inconsistency of PT sessions has limited her in per progress. Educated patient on importance of maintaining consistent HEP regimen at home to promote progress. Patient  agreeable and requested updated HEP. Educated patient on HEP handout. Patient reported understanding. Updated goals this session- patient reports compliance with HEP and has shown improvement in pain levels with R hip flexion and ER; ROM improvement noted in ER. Strength unchanged at this time. Patient reports 15 minutes of walking and 5 minutes of sitting before limited by pain at this time. Corrected form during clamshells this session as patient demonstrated lumbar rotation throughout- patient noting increased muscle burn after correction of form. Able to perform LE stretching to ease muscle burn. Patient still with decreased R ankle strength which was addressed with standing heel-toe raises with cues required to avoid hip compensations. Patient without report of increased pain at end of session. Would benefit from continued skilled PT services 2x/week for 6 weeks to address remaining goals.     PT Treatment/Interventions  ADLs/Self Care Home Management;Cryotherapy;Electrical Stimulation;Iontophoresis 66m/ml Dexamethasone;Moist Heat;Ultrasound;Gait training;Stair training;Functional mobility training;Therapeutic activities;Therapeutic exercise;Manual techniques;Patient/family education;Neuromuscular re-education;Balance training;Passive range of motion;Dry needling;Energy conservation;Splinting;Taping;Vasopneumatic Device    Consulted and Agree with Plan of Care  Patient       Patient will benefit from skilled therapeutic intervention in order to improve the following deficits and impairments:  Hypomobility, Decreased activity tolerance, Decreased strength, Pain, Decreased mobility, Difficulty walking, Decreased range of motion, Postural dysfunction  Visit Diagnosis: Pain in right hip  Stiffness of right hip, not elsewhere classified  Muscle weakness (generalized)  Difficulty in walking, not elsewhere classified     Problem List Patient Active Problem List   Diagnosis Date Noted  . Abnormal  uterine bleeding 09/30/2014  . Abdominal pain, epigastric 05/01/2014  . Chronic LBP 01/11/2014  . Anxiety 06/15/2013  . Dysmenorrhea 06/15/2013  . Blood pressure elevated 06/15/2013  . Anemia, iron deficiency 06/15/2013  . Headache, migraine 06/15/2013  . H/O gastric bypass 11/27/2012  . Hx of cesarean section 03/11/2012  . Iron deficiency 03/07/2012     YJanene Harvey PT, DPT 03/29/18 12:21 PM   [facility] [address] [place] [address]Phone: [phone]  Fax:  [phone] Name: EJOURNEI THOMASSENMRN: [id number] of Birth: 101/18/1982

## 2018-04-05 ENCOUNTER — Ambulatory Visit: Payer: Medicaid Other

## 2018-04-06 ENCOUNTER — Ambulatory Visit: Payer: Medicaid Other | Admitting: Rehabilitative and Restorative Service Providers"

## 2018-04-06 ENCOUNTER — Encounter: Payer: Self-pay | Admitting: Rehabilitative and Restorative Service Providers"

## 2018-04-06 DIAGNOSIS — M25651 Stiffness of right hip, not elsewhere classified: Secondary | ICD-10-CM

## 2018-04-06 DIAGNOSIS — R262 Difficulty in walking, not elsewhere classified: Secondary | ICD-10-CM

## 2018-04-06 DIAGNOSIS — M25551 Pain in right hip: Secondary | ICD-10-CM

## 2018-04-06 DIAGNOSIS — M6281 Muscle weakness (generalized): Secondary | ICD-10-CM

## 2018-04-06 NOTE — Therapy (Signed)
[facility] [address]  [place], [place], [address] Phone: [phone]   Fax:  [phone]  Physical Therapy Treatment  Patient Details  Name: Jennifer Brown MRN: [id number] Date of Birth: 1980-11-14 Referring Provider: Renaldo Reel, PA   Encounter Date: 04/06/2018  PT End of Session - 04/06/18 0946    Visit Number  4    Number of Visits  15    Date for PT Re-Evaluation  05/10/18    Authorization Type  Medicaid    Authorization Time Period  3 visits 8.8.19 - 8.28.19    Authorization - Visit Number  3    Authorization - Number of Visits  3    PT Start Time  5284    PT Stop Time  1048    PT Time Calculation (min)  61 min    Activity Tolerance  Patient tolerated treatment well       Past Medical History:  Diagnosis Date  . Anemia   . Depression    no meds x 1 yr  . Hx of gastric bypass 2004  . Insomnia   . Morbid obesity ([place])   . Ovarian cyst   . Substance abuse ([facility])    Tobacco, ETOH  . Urinary tract infection     Past Surgical History:  Procedure Laterality Date  . BREAST REDUCTION SURGERY    . BREAST SURGERY  12/25/08   Reduction  . CESAREAN SECTION  2001, 2005, 2009  . CHOLECYSTECTOMY  08/09/02  . GASTRIC BYPASS  01/23/03  . HERNIA REPAIR  11/13/04  . INDUCED ABORTION      There were no vitals filed for this visit.  Subjective Assessment - 04/06/18 0953    Subjective  Patient reports pain on Friday; discomfort over the weekend; pain again yesterday. Feeling discomfort today. She has been working on her exercises but did not do them when she was hurting more on Friday. Discussed the importance of doing her exercises even when she is hurting.     Pain Score  7     Pain Location  Hip    Pain Orientation  Right    Pain Descriptors / Indicators  Throbbing;Discomfort    Pain Type  Chronic pain                       [facility] Adult PT Treatment/Exercise - 04/06/18 0001      Therapeutic Activites    Therapeutic Activities  --   myofacial ball release work prone/supine Rt hip mm     Knee/Hip Exercises: Stretches   Hip Flexor Stretch  Right;3 reps;30 seconds   seated hip flexor stretch    Piriformis Stretch  Right;3 reps;30 seconds   supine travell stretch - counter pressure Rt groin      Knee/Hip Exercises: Aerobic   Stationary Bike  L2 x 6 min       Knee/Hip Exercises: Standing   Hip Abduction  AROM;Right;Left;10 reps;Knee straight    Hip Extension  AROM;Right;Left;10 reps;Knee straight      Knee/Hip Exercises: Prone   Other Prone Exercises  knee flexion stretch for quads Rt 30 sec x 1 rep PT assist       Moist Heat Therapy   Number Minutes Moist Heat  15 Minutes    Moist Heat Location  Lumbar Spine;Hip   Rt anterior hip      Electrical Stimulation   Electrical Stimulation Location  Rt piriformis/hip  abductore; Rt anterior hip flexors     Electrical Stimulation Action  IFC    Electrical Stimulation Parameters  to tolerance    Electrical Stimulation Goals  Pain;Tone      Manual Therapy   Manual therapy comments  pt supine and prone     Soft tissue mobilization  deep tissue work through the anterior hip/psoas/hip fexors w/ pt prone; piriformis/hip abductors w/ pt prone              PT Education - 04/06/18 1034    Education Details  HEP TENS DN    Person(s) Educated  Patient    Methods  Explanation;Demonstration;Tactile cues;Verbal cues;Handout    Comprehension  Verbalized understanding;Returned demonstration;Verbal cues required;Tactile cues required       PT Short Term Goals - 03/29/18 1023      PT SHORT TERM GOAL #1   Title  Patient to be independent with initial HEP.    Time  2    Period  Weeks    Status  Achieved        PT Long Term Goals - 04/06/18 0947      PT LONG TERM GOAL #1   Title  Patient to be independent with advanced HEP.    Time  6    Period  Weeks    Status  Partially Met      PT LONG TERM GOAL #2    Title  Patient to demonstrate R hip AROM without pain limiting.     Time  6    Period  Weeks    Status  On-going      PT LONG TERM GOAL #3   Title  Patient to demonstrate B LE strength >=4+/5.    Time  6    Period  Weeks    Status  On-going      PT LONG TERM GOAL #4   Title  Patient to tolerate walking for 1 hour without pain limiting.     Time  6    Period  Weeks    Status  On-going      PT LONG TERM GOAL #5   Title  Patient to report tolerance of sitting in car for 2 hours without pain limiting.    Time  6    Period  Weeks    Status  On-going            Plan - 04/06/18 1035    Clinical Impression Statement  Patient reports continued pain and tightness through the Rt hip but states that she has been more consistent with HEP. Patient has significant muscular tightness through the anterior Rt hip in hip flexors/quads and posterior in piriformis and hip abductors. Responded well to manual work and trial of modalities. May benefit form trial of DN to tight musculature Rt anterior/posterior hip.     Rehab Potential  Good    PT Frequency  1x / week    PT Duration  8 weeks    PT Treatment/Interventions  ADLs/Self Care Home Management;Cryotherapy;Electrical Stimulation;Iontophoresis 41m/ml Dexamethasone;Moist Heat;Ultrasound;Gait training;Stair training;Functional mobility training;Therapeutic activities;Therapeutic exercise;Manual techniques;Patient/family education;Neuromuscular re-education;Balance training;Passive range of motion;Dry needling;Energy conservation;Splinting;Taping;Vasopneumatic Device    PT Next Visit Plan  assess response to new stretches; ball release work; manual work and estim. Consider trial fo DN     Consulted and Agree with Plan of Care  Patient       Patient will benefit from skilled therapeutic intervention in order to improve the following deficits and impairments:  Hypomobility, Decreased activity tolerance, Decreased strength, Pain, Decreased mobility,  Difficulty walking, Decreased range of motion, Postural dysfunction  Visit Diagnosis: Pain in right hip  Stiffness of right hip, not elsewhere classified  Muscle weakness (generalized)  Difficulty in walking, not elsewhere classified     Problem List Patient Active Problem List   Diagnosis Date Noted  . Abnormal uterine bleeding 09/30/2014  . Abdominal pain, epigastric 05/01/2014  . Chronic LBP 01/11/2014  . Anxiety 06/15/2013  . Dysmenorrhea 06/15/2013  . Blood pressure elevated 06/15/2013  . Anemia, iron deficiency 06/15/2013  . Headache, migraine 06/15/2013  . H/O gastric bypass 11/27/2012  . Hx of cesarean section 03/11/2012  . Iron deficiency 03/07/2012    Anaaya Fuster Nilda Simmer PT, MPH  04/06/2018, 10:42 AM  [facility] [address]  [place], [place], [address] Phone: [phone]   Fax:  [phone]  Name: SHENIQUA CAROLAN MRN: [id number] Date of Birth: 08-16-1980

## 2018-04-06 NOTE — Patient Instructions (Signed)
    Piriformis Stretch   Lying on back, pull right knee toward opposite shoulder. Hold 30 seconds. Repeat 3 times. Do 2-3 sessions per day.   TENS UNIT: This is helpful for muscle pain and spasm.   Search and Purchase a TENS 7000 2nd edition at www.tenspros.com. It should be less than $30.     TENS unit instructions: Do not shower or bathe with the unit on Turn the unit off before removing electrodes or batteries If the electrodes lose stickiness add a drop of water to the electrodes after they are disconnected from the unit and place on plastic sheet. If you continued to have difficulty, call the TENS unit company to purchase more electrodes. Do not apply lotion on the skin area prior to use. Make sure the skin is clean and dry as this will help prolong the life of the electrodes. After use, always check skin for unusual red areas, rash or other skin difficulties. If there are any skin problems, does not apply electrodes to the same area. Never remove the electrodes from the unit by pulling the wires. Do not use the TENS unit or electrodes other than as directed. Do not change electrode placement without consultating your therapist or physician. Keep 2 fingers with between each electrode.   Trigger Point Dry Needling  . What is Trigger Point Dry Needling (DN)? o DN is a physical therapy technique used to treat muscle pain and dysfunction. Specifically, DN helps deactivate muscle trigger points (muscle knots).  o A thin filiform needle is used to penetrate the skin and stimulate the underlying trigger point. The goal is for a local twitch response (LTR) to occur and for the trigger point to relax. No medication of any kind is injected during the procedure.   . What Does Trigger Point Dry Needling Feel Like?  o The procedure feels different for each individual patient. Some patients report that they do not actually feel the needle enter the skin and overall the process is not  painful. Very mild bleeding may occur. However, many patients feel a deep cramping in the muscle in which the needle was inserted. This is the local twitch response.   Marland Kitchen. How Will I feel after the treatment? o Soreness is normal, and the onset of soreness may not occur for a few hours. Typically this soreness does not last longer than two days.  o Bruising is uncommon, however; ice can be used to decrease any possible bruising.  o In rare cases feeling tired or nauseous after the treatment is normal. In addition, your symptoms may get worse before they get better, this period will typically not last longer than 24 hours.   . What Can I do After My Treatment? o Increase your hydration by drinking more water for the next 24 hours. o You may place ice or heat on the areas treated that have become sore, however, do not use heat on inflamed or bruised areas. Heat often brings more relief post needling. o You can continue your regular activities, but vigorous activity is not recommended initially after the treatment for 24 hours. o DN is best combined with other physical therapy such as strengthening, stretching, and other therapies.

## 2018-04-07 ENCOUNTER — Ambulatory Visit: Payer: Medicaid Other

## 2018-04-07 DIAGNOSIS — M25651 Stiffness of right hip, not elsewhere classified: Secondary | ICD-10-CM

## 2018-04-07 DIAGNOSIS — M25551 Pain in right hip: Secondary | ICD-10-CM

## 2018-04-07 DIAGNOSIS — M6281 Muscle weakness (generalized): Secondary | ICD-10-CM

## 2018-04-07 DIAGNOSIS — R262 Difficulty in walking, not elsewhere classified: Secondary | ICD-10-CM

## 2018-04-07 NOTE — Therapy (Signed)
[facility] [address]  [place], [place], [address] Phone: [phone]   Fax:  [phone]  Physical Therapy Treatment  Patient Details  Name: Jennifer Brown MRN: [id number] Date of Birth: Dec 19, 1980 Referring Provider: Renaldo Reel, PA   Encounter Date: 04/07/2018  PT End of Session - 04/07/18 1014    Visit Number  5    Number of Visits  15    Date for PT Re-Evaluation  05/10/18    Authorization Type  Medicaid    Authorization Time Period  12 visits 8.29.19 - 10.9.19    Authorization - Visit Number  1    Authorization - Number of Visits  12    PT Start Time  1008    PT Stop Time  1110    PT Time Calculation (min)  62 min    Activity Tolerance  Patient tolerated treatment well    Behavior During Therapy  [facility] for tasks assessed/performed       Past Medical History:  Diagnosis Date  . Anemia   . Depression    no meds x 1 yr  . Hx of gastric bypass 2004  . Insomnia   . Morbid obesity ([place])   . Ovarian cyst   . Substance abuse (HCC)    Tobacco, ETOH  . Urinary tract infection     Past Surgical History:  Procedure Laterality Date  . BREAST REDUCTION SURGERY    . BREAST SURGERY  12/25/08   Reduction  . CESAREAN SECTION  2001, 2005, 2009  . CHOLECYSTECTOMY  08/09/02  . GASTRIC BYPASS  01/23/03  . HERNIA REPAIR  11/13/04  . INDUCED ABORTION      There were no vitals filed for this visit.  Subjective Assessment - 04/07/18 1011    Subjective  Pt. noting good benefit from E-stim from yesterday's visit.      Diagnostic tests  02/18/18 R hip x ray: Healed right greater trochanteric fracture with surrounding heterotopic ossification. Anatomic alignment and position    Patient Stated Goals  be able to manage pain and tools for comfort and relief    Currently in Pain?  Yes    Pain Location  Hip    Pain Orientation  Right    Pain Descriptors / Indicators  Throbbing;Discomfort    Pain Type  Chronic  pain    Pain Frequency  Constant    Aggravating Factors   prolonged walking, standing    Multiple Pain Sites  No                       [facility] Adult PT Treatment/Exercise - 04/07/18 1020      Knee/Hip Exercises: Stretches   Hip Flexor Stretch  Right;3 reps;30 seconds    Hip Flexor Stretch Limitations  Seated and mod thomas position     Piriformis Stretch  Right;2 reps;30 seconds    Piriformis Stretch Limitations  KTOS      Knee/Hip Exercises: Aerobic   Stationary Bike  L2 x 6 min       Knee/Hip Exercises: Standing   Functional Squat  10 reps;3 seconds    Functional Squat Limitations  Cues for proper positioning and for glute activation       Knee/Hip Exercises: Supine   Other Supine Knee/Hip Exercises  Hooklying alternating clam shell with red looped TB at knees x 12 reps      Knee/Hip Exercises: Sidelying   Hip ABduction  Right;10 reps;Strengthening    Clams  12x each side with red TB around knees      Moist Heat Therapy   Number Minutes Moist Heat  15 Minutes    Moist Heat Location  Hip   R buttocks      Electrical Stimulation   Electrical Stimulation Location  R buttocks     Electrical Stimulation Action  IFC    Electrical Stimulation Parameters  to tolerance, 15'    Electrical Stimulation Goals  Pain;Tone               PT Short Term Goals - 03/29/18 1023      PT SHORT TERM GOAL #1   Title  Patient to be independent with initial HEP.    Time  2    Period  Weeks    Status  Achieved        PT Long Term Goals - 04/06/18 0947      PT LONG TERM GOAL #1   Title  Patient to be independent with advanced HEP.    Time  6    Period  Weeks    Status  Partially Met      PT LONG TERM GOAL #2   Title  Patient to demonstrate R hip AROM without pain limiting.     Time  6    Period  Weeks    Status  On-going      PT LONG TERM GOAL #3   Title  Patient to demonstrate B LE strength >=4+/5.    Time  6    Period  Weeks    Status  On-going       PT LONG TERM GOAL #4   Title  Patient to tolerate walking for 1 hour without pain limiting.     Time  6    Period  Weeks    Status  On-going      PT LONG TERM GOAL #5   Title  Patient to report tolerance of sitting in car for 2 hours without pain limiting.    Time  6    Period  Weeks    Status  On-going            Plan - 04/07/18 1015    Clinical Impression Statement  [place] noting good relief from R hip pain following E-stim. applied in yesterday's session.  Progressed proximal hip strengthening activities today with good tolerance and pt. noting relief with STM to R buttocks/hip flexors.  Did require some cueing with HEP review today for proper positioning for seated hip flexor stretch however improved technique following this.  Ended visit with trial of TENS 7000 2nd edition TENS unit to R buttocks as pt. noting she wishes to purchase this unit for home use and further pain relief.  Will continue to progress toward goals.      PT Treatment/Interventions  ADLs/Self Care Home Management;Cryotherapy;Electrical Stimulation;Iontophoresis 31m/ml Dexamethasone;Moist Heat;Ultrasound;Gait training;Stair training;Functional mobility training;Therapeutic activities;Therapeutic exercise;Manual techniques;Patient/family education;Neuromuscular re-education;Balance training;Passive range of motion;Dry needling;Energy conservation;Splinting;Taping;Vasopneumatic Device    Consulted and Agree with Plan of Care  Patient       Patient will benefit from skilled therapeutic intervention in order to improve the following deficits and impairments:  Hypomobility, Decreased activity tolerance, Decreased strength, Pain, Decreased mobility, Difficulty walking, Decreased range of motion, Postural dysfunction  Visit Diagnosis: Pain in right hip  Stiffness of right hip, not elsewhere classified  Muscle weakness (generalized)  Difficulty in walking, not elsewhere classified  Problem List Patient  Active Problem List   Diagnosis Date Noted  . Abnormal uterine bleeding 09/30/2014  . Abdominal pain, epigastric 05/01/2014  . Chronic LBP 01/11/2014  . Anxiety 06/15/2013  . Dysmenorrhea 06/15/2013  . Blood pressure elevated 06/15/2013  . Anemia, iron deficiency 06/15/2013  . Headache, migraine 06/15/2013  . H/O gastric bypass 11/27/2012  . Hx of cesarean section 03/11/2012  . Iron deficiency 03/07/2012    Bess Harvest, PTA 04/07/18 12:28 PM   [facility] [address]  [place], [place], [address] Phone: [phone]   Fax:  [phone]  Name: KENNI NEWTON MRN: [id number] Date of Birth: [birth year]

## 2018-04-12 ENCOUNTER — Ambulatory Visit: Payer: Medicaid Other | Admitting: Physical Therapy

## 2018-04-13 ENCOUNTER — Ambulatory Visit: Payer: Medicaid Other | Attending: Physician Assistant | Admitting: Physical Therapy

## 2018-04-13 DIAGNOSIS — M25551 Pain in right hip: Secondary | ICD-10-CM | POA: Diagnosis present

## 2018-04-13 DIAGNOSIS — R262 Difficulty in walking, not elsewhere classified: Secondary | ICD-10-CM | POA: Diagnosis present

## 2018-04-13 DIAGNOSIS — M6281 Muscle weakness (generalized): Secondary | ICD-10-CM | POA: Diagnosis present

## 2018-04-13 DIAGNOSIS — M25651 Stiffness of right hip, not elsewhere classified: Secondary | ICD-10-CM

## 2018-04-13 NOTE — Patient Instructions (Signed)
Butterfly, Supine    Lie on back, feet together. Lower knees toward floor, place pillow under Right hip for support. Hold __30_ seconds. Repeat __2_ times per session. Do _2__ sessions per day.  Adductor Stretch: Frog Pose    Start with toes touching, hips back. Move hips forward to line up with knees. Open feet to knee width, dorsiflex. Hold for ____ breaths. Repeat ____ times.

## 2018-04-13 NOTE — Therapy (Signed)
[facility] [address]  [place], [place], [address] Phone: [phone]   Fax:  [phone]  Physical Therapy Treatment  Patient Details  Name: Jennifer Brown MRN: [id number] Date of Birth: December 07, 1980 Referring Provider: Renaldo Reel, PA   Encounter Date: 04/13/2018  PT End of Session - 04/13/18 1115    Visit Number  6    Number of Visits  15    Date for PT Re-Evaluation  05/10/18    Authorization Type  Medicaid    Authorization Time Period  12 visits 8.29.19 - 10.9.19    Authorization - Visit Number  2    Authorization - Number of Visits  12    PT Start Time  1105    PT Stop Time  1200    PT Time Calculation (min)  55 min    Activity Tolerance  Patient tolerated treatment well    Behavior During Therapy  [facility] for tasks assessed/performed       Past Medical History:  Diagnosis Date  . Anemia   . Depression    no meds x 1 yr  . Hx of gastric bypass 2004  . Insomnia   . Morbid obesity ([place])   . Ovarian cyst   . Substance abuse (HCC)    Tobacco, ETOH  . Urinary tract infection     Past Surgical History:  Procedure Laterality Date  . BREAST REDUCTION SURGERY    . BREAST SURGERY  12/25/08   Reduction  . CESAREAN SECTION  2001, 2005, 2009  . CHOLECYSTECTOMY  08/09/02  . GASTRIC BYPASS  01/23/03  . HERNIA REPAIR  11/13/04  . INDUCED ABORTION      There were no vitals filed for this visit.  Subjective Assessment - 04/13/18 1115    Subjective  "I had something new pop up since last visit".  Pt points to her Rt medial ankle where there is an palpable raised area (just above malleoli). Other than that, "Today is a good day (in regards to her hip)".     Patient Stated Goals  be able to manage pain and tools for comfort and relief    Currently in Pain?  Yes    Pain Score  6     Pain Location  Hip    Pain Orientation  Right    Pain Descriptors / Indicators  Throbbing;Discomfort    Aggravating  Factors   prolonged walking or sitting    Pain Relieving Factors  shifting in seat, medicine, self massage.          [facility] PT Assessment - 04/13/18 0001      Assessment   Medical Diagnosis  R hip pain    Referring Provider  Renaldo Reel, PA    Onset Date/Surgical Date  03/20/17    Prior Therapy  Yes- for this issue      [facility] Adult PT Treatment/Exercise - 04/13/18 0001      Knee/Hip Exercises: Stretches   Hip Flexor Stretch  Right;Left;3 reps;20 seconds   seated    Piriformis Stretch  Right;30 seconds;Left;2 reps seated.   Piriformis Stretch Limitations  KTOS    Other Knee/Hip Stretches  childs pose with lateral shifting Rt/Lt, 4 reps, then childs pose with knees wide (for adductor stretch) to tolerance x 2 reps;  butterfly stretch in supine with support under Rt thigh (to assist pt in relaxing LE), then with downward pressure at top of leg with hands, x 3 reps;  cat/ cow x 4 reps. Seated hamstring stretch x 3 reps on each side.         Knee/Hip Exercises: Aerobic   Stationary Bike  L2 x 5 min       Moist Heat Therapy   Number Minutes Moist Heat  15 Minutes    Moist Heat Location  Hip   Rt (ant/post)     Electrical Stimulation   Electrical Stimulation Location  Rt lateral/anterior hip    Electrical Stimulation Action  IFC    Electrical Stimulation Parameters  to tolerance    Electrical Stimulation Goals  Pain;Tone      Manual Therapy   Manual Therapy  Taping    Soft tissue mobilization  STM to Rt ant / posterior hip, Rt mid/distal adductors    Passive ROM  Rt hip circumduction in supine (pt very guarded)     Kinesiotex  Edema;Facilitate Muscle      Kinesiotix   Edema  X of Reg rock tape applied to Rt medial ankle with 10% stretch to decompress tissue and decrease pain.     Facilitate Muscle   X of Reg rock tape applied to Rt ant hip to decrease pain, improve proprioception, decompress tissue.                PT Short Term Goals - 03/29/18 1023       PT SHORT TERM GOAL #1   Title  Patient to be independent with initial HEP.    Time  2    Period  Weeks    Status  Achieved        PT Long Term Goals - 04/13/18 1125      PT LONG TERM GOAL #1   Title  Patient to be independent with advanced HEP.    Time  6    Period  Weeks    Status  Partially Met      PT LONG TERM GOAL #2   Title  Patient to demonstrate R hip AROM without pain limiting.     Time  6    Period  Weeks    Status  On-going      PT LONG TERM GOAL #3   Title  Patient to demonstrate B LE strength >=4+/5.    Time  6    Period  Weeks    Status  On-going      PT LONG TERM GOAL #4   Title  Patient to tolerate walking for 1 hour without pain limiting.     Baseline  able to walk 15 min- reported 04/13/18    Time  6    Period  Weeks    Status  On-going      PT LONG TERM GOAL #5   Title  Patient to report tolerance of sitting in car for 2 hours without pain limiting.    Baseline  has to continuously shift.  can tolerate ~20 min. reported 04/13/18    Time  6    Period  Weeks    Status  On-going            Plan - 04/13/18 1317    Clinical Impression Statement  Pt initially reporting increase in Rt medial ankle pain (with visible pocket of swelling); pain decreased with application of Rock tape to area.  Pt requires increased time in between stretches and position changes due to increased Rt hip pain.  Pt point tender with palpable tightness in Rt TFL, piriformis, and adductors; pain  reduced with STM and MHP/estim to area.  Pt making gradual progress towards goals.     Rehab Potential  Good    PT Frequency  1x / week    PT Duration  8 weeks    PT Treatment/Interventions  ADLs/Self Care Home Management;Cryotherapy;Electrical Stimulation;Iontophoresis 22m/ml Dexamethasone;Moist Heat;Ultrasound;Gait training;Stair training;Functional mobility training;Therapeutic activities;Therapeutic exercise;Manual techniques;Patient/family education;Neuromuscular re-education;Balance  training;Passive range of motion;Dry needling;Energy conservation;Splinting;Taping;Vasopneumatic Device    PT Next Visit Plan  continue progressive Rt hip stretches/ strengthening.  manual work and estim. Consider trial fo DN     Consulted and Agree with Plan of Care  Patient       Patient will benefit from skilled therapeutic intervention in order to improve the following deficits and impairments:  Hypomobility, Decreased activity tolerance, Decreased strength, Pain, Decreased mobility, Difficulty walking, Decreased range of motion, Postural dysfunction  Visit Diagnosis: Pain in right hip  Stiffness of right hip, not elsewhere classified  Muscle weakness (generalized)  Difficulty in walking, not elsewhere classified     Problem List Patient Active Problem List   Diagnosis Date Noted  . Abnormal uterine bleeding 09/30/2014  . Abdominal pain, epigastric 05/01/2014  . Chronic LBP 01/11/2014  . Anxiety 06/15/2013  . Dysmenorrhea 06/15/2013  . Blood pressure elevated 06/15/2013  . Anemia, iron deficiency 06/15/2013  . Headache, migraine 06/15/2013  . H/O gastric bypass 11/27/2012  . Hx of cesarean section 03/11/2012  . Iron deficiency 03/07/2012   JKerin Perna PTA 04/13/18 1:37 PM  [facility] [address] [place] [address]: [phone]  Fax:  [phone] Name: ELOLLY GLAUSMRN: [id number] of Birth: [birth year]

## 2018-04-14 ENCOUNTER — Ambulatory Visit: Payer: Medicaid Other

## 2018-04-14 DIAGNOSIS — M25651 Stiffness of right hip, not elsewhere classified: Secondary | ICD-10-CM

## 2018-04-14 DIAGNOSIS — R262 Difficulty in walking, not elsewhere classified: Secondary | ICD-10-CM

## 2018-04-14 DIAGNOSIS — M6281 Muscle weakness (generalized): Secondary | ICD-10-CM

## 2018-04-14 DIAGNOSIS — M25551 Pain in right hip: Secondary | ICD-10-CM

## 2018-04-14 NOTE — Therapy (Signed)
[facility] [address]  [place], [place], [address] Phone: [phone]   Fax:  [phone]  Physical Therapy Treatment  Patient Details  Name: Jennifer Brown MRN: [id number] Date of Birth: 07/01/1981 Referring Provider: Renaldo Reel, PA   Encounter Date: 04/14/2018  PT End of Session - 04/14/18 1018    Visit Number  7    Number of Visits  15    Date for PT Re-Evaluation  05/10/18    Authorization Type  Medicaid    Authorization Time Period  12 visits 8.29.19 - 10.9.19    Authorization - Visit Number  3    Authorization - Number of Visits  12    PT Start Time  7654    PT Stop Time  1059    PT Time Calculation (min)  44 min    Activity Tolerance  Patient tolerated treatment well    Behavior During Therapy  [facility] for tasks assessed/performed       Past Medical History:  Diagnosis Date  . Anemia   . Depression    no meds x 1 yr  . Hx of gastric bypass 2004  . Insomnia   . Morbid obesity ([place])   . Ovarian cyst   . Substance abuse (H[facility])    Tobacco, ETOH  . Urinary tract infection     Past Surgical History:  Procedure Laterality Date  . BREAST REDUCTION SURGERY    . BREAST SURGERY  12/25/08   Reduction  . CESAREAN SECTION  2001, 2005, 2009  . CHOLECYSTECTOMY  08/09/02  . GASTRIC BYPASS  01/23/03  . HERNIA REPAIR  11/13/04  . INDUCED ABORTION      There were no vitals filed for this visit.  Subjective Assessment - 04/14/18 1025    Subjective  Pt. noting some benefit from taping to ankle.  pt. noting pain with pressure on medial ankle/calf which she has an MD appointment for on Monday.      Diagnostic tests  02/18/18 R hip x ray: Healed right greater trochanteric fracture with surrounding heterotopic ossification. Anatomic alignment and position    Patient Stated Goals  be able to manage pain and tools for comfort and relief    Currently in Pain?  Yes    Pain Score  --   6.5/10   Pain Location   Hip    Pain Orientation  Right    Pain Descriptors / Indicators  --   "Pulsing"   Pain Type  Chronic pain    Multiple Pain Sites  No                       [facility] Adult PT Treatment/Exercise - 04/14/18 1046      Knee/Hip Exercises: Stretches   Piriformis Stretch  Right;30 seconds;Left;2 reps    Piriformis Stretch Limitations  KTOS      Knee/Hip Exercises: Aerobic   Nustep  Lvl 4, 7 min       Knee/Hip Exercises: Machines for Strengthening   Cybex Knee Flexion  B con/R LE: 15# x 10 reps       Knee/Hip Exercises: Supine   Other Supine Knee/Hip Exercises  Hooklying alternating clam shell with yellow looped TB at knees x 10 reps    Other Supine Knee/Hip Exercises  Hooklying alternating hip flexion march with yellow TB at knees x 10 rpes each wya       Knee/Hip Exercises: Sidelying   Hip  ABduction  15 reps;Strengthening;Right    Clams  yellow TB at knees       Moist Heat Therapy   Number Minutes Moist Heat  15 Minutes      Manual Therapy   Manual Therapy  Soft tissue mobilization;Myofascial release    Soft tissue mobilization  STM to R buttocks/piriformis in sidelying with R LE elevated on bolster     Myofascial Release  R buttocks TPR     Passive ROM  manual R glute, piriformis, SKTC, HS stretch x 30 sec each                PT Short Term Goals - 03/29/18 1023      PT SHORT TERM GOAL #1   Title  Patient to be independent with initial HEP.    Time  2    Period  Weeks    Status  Achieved        PT Long Term Goals - 04/13/18 1125      PT LONG TERM GOAL #1   Title  Patient to be independent with advanced HEP.    Time  6    Period  Weeks    Status  Partially Met      PT LONG TERM GOAL #2   Title  Patient to demonstrate R hip AROM without pain limiting.     Time  6    Period  Weeks    Status  On-going      PT LONG TERM GOAL #3   Title  Patient to demonstrate B LE strength >=4+/5.    Time  6    Period  Weeks    Status  On-going      PT  LONG TERM GOAL #4   Title  Patient to tolerate walking for 1 hour without pain limiting.     Baseline  able to walk 15 min- reported 04/13/18    Time  6    Period  Weeks    Status  On-going      PT LONG TERM GOAL #5   Title  Patient to report tolerance of sitting in car for 2 hours without pain limiting.    Baseline  has to continuously shift.  can tolerate ~20 min. reported 04/13/18    Time  6    Period  Weeks    Status  On-going            Plan - 04/14/18 1023    Clinical Impression Statement  Jennifer Brown reporting continued "golf ball" swelling at medial ankle today however notes taping applied yesterday has improved this area.  Pt. contacted MD over concerns regarding ankle and has an appointment on Monday.  Medial ankle ttp however was not palpably warm today.  Session focusing on manual therapy for improved R proximal hip/glute comfort and reduction in muscular tone along with gentle proximal hip strengthening to pt. tolerance.  Ended visit with pt. noting she was pain free thus modalities deferred.      PT Treatment/Interventions  ADLs/Self Care Home Management;Cryotherapy;Electrical Stimulation;Iontophoresis 59m/ml Dexamethasone;Moist Heat;Ultrasound;Gait training;Stair training;Functional mobility training;Therapeutic activities;Therapeutic exercise;Manual techniques;Patient/family education;Neuromuscular re-education;Balance training;Passive range of motion;Dry needling;Energy conservation;Splinting;Taping;Vasopneumatic Device    Consulted and Agree with Plan of Care  Patient       Patient will benefit from skilled therapeutic intervention in order to improve the following deficits and impairments:  Hypomobility, Decreased activity tolerance, Decreased strength, Pain, Decreased mobility, Difficulty walking, Decreased range of motion, Postural dysfunction  Visit Diagnosis: Pain  in right hip  Stiffness of right hip, not elsewhere classified  Muscle weakness  (generalized)  Difficulty in walking, not elsewhere classified     Problem List Patient Active Problem List   Diagnosis Date Noted  . Abnormal uterine bleeding 09/30/2014  . Abdominal pain, epigastric 05/01/2014  . Chronic LBP 01/11/2014  . Anxiety 06/15/2013  . Dysmenorrhea 06/15/2013  . Blood pressure elevated 06/15/2013  . Anemia, iron deficiency 06/15/2013  . Headache, migraine 06/15/2013  . H/O gastric bypass 11/27/2012  . Hx of cesarean section 03/11/2012  . Iron deficiency 03/07/2012    Bess Harvest, PTA 04/14/18 12:51 PM   [facility] [address]  [place], [place], [address] Phone: [phone]   Fax:  [phone]  Name: Jennifer Brown MRN: [id number] Date of Birth: 1980-11-19

## 2018-04-18 ENCOUNTER — Ambulatory Visit: Payer: Medicaid Other

## 2018-04-21 ENCOUNTER — Ambulatory Visit: Payer: Medicaid Other

## 2018-04-25 ENCOUNTER — Ambulatory Visit: Payer: Medicaid Other

## 2018-04-25 DIAGNOSIS — M25551 Pain in right hip: Secondary | ICD-10-CM

## 2018-04-25 DIAGNOSIS — R262 Difficulty in walking, not elsewhere classified: Secondary | ICD-10-CM

## 2018-04-25 DIAGNOSIS — M25651 Stiffness of right hip, not elsewhere classified: Secondary | ICD-10-CM

## 2018-04-25 DIAGNOSIS — M6281 Muscle weakness (generalized): Secondary | ICD-10-CM

## 2018-04-25 NOTE — Therapy (Signed)
[facility] [address]  [place], [place], [address] Phone: [phone]   Fax:  [phone]  Physical Therapy Treatment  Patient Details  Name: Jennifer Brown MRN: [id number] Date of Birth: [birth year] Referring Provider: Renaldo Reel, PA   Encounter Date: 04/25/2018  PT End of Session - 04/25/18 1317    Visit Number  8    Number of Visits  15    Date for PT Re-Evaluation  05/10/18    Authorization Type  Medicaid    Authorization Time Period  12 visits 8.29.19 - 10.9.19    Authorization - Visit Number  4    Authorization - Number of Visits  12    PT Start Time  1314    PT Stop Time  1358    PT Time Calculation (min)  44 min    Activity Tolerance  Patient tolerated treatment well    Behavior During Therapy  [facility] for tasks assessed/performed       Past Medical History:  Diagnosis Date  . Anemia   . Depression    no meds x 1 yr  . Hx of gastric bypass 2004  . Insomnia   . Morbid obesity ([place])   . Ovarian cyst   . Substance abuse ([facility])    Tobacco, ETOH  . Urinary tract infection     Past Surgical History:  Procedure Laterality Date  . BREAST REDUCTION SURGERY    . BREAST SURGERY  12/25/08   Reduction  . CESAREAN SECTION  2001, 2005, 2009  . CHOLECYSTECTOMY  08/09/02  . GASTRIC BYPASS  01/23/03  . HERNIA REPAIR  11/13/04  . INDUCED ABORTION      There were no vitals filed for this visit.  Subjective Assessment - 04/25/18 1316    Subjective  Pt. noting R medial distal ankle "knott" remains.      Diagnostic tests  02/18/18 R hip x ray: Healed right greater trochanteric fracture with surrounding heterotopic ossification. Anatomic alignment and position    Patient Stated Goals  be able to manage pain and tools for comfort and relief    Currently in Pain?  No/denies    Pain Score  0-No pain    Multiple Pain Sites  No                       [facility] Adult PT Treatment/Exercise -  04/25/18 1330      Knee/Hip Exercises: Stretches   Piriformis Stretch  Right;30 seconds;Left;2 reps    Piriformis Stretch Limitations  KTOS      Knee/Hip Exercises: Aerobic   Nustep  Lvl 5, 7 min       Knee/Hip Exercises: Machines for Strengthening   Cybex Knee Flexion  B con/R LE: 20# x 10 reps       Knee/Hip Exercises: Standing   Hip Flexion  Right;Left;10 reps;Knee straight    Terminal Knee Extension  Right;15 reps;Strengthening    Theraband Level (Terminal Knee Extension)  Level 4 (Blue)    Terminal Knee Extension Limitations  Heavy cues required for proper technique     Hip Abduction  Right;Left;10 reps;Stengthening    Abduction Limitations  Red looped TB at ankles    Hip Extension  Right;Left;10 reps;Stengthening    Extension Limitations  Red looped TB at ankles    Forward Step Up  Right;15 reps;Step Height: 6";Hand Hold: 1    Forward Step Up Limitations  1 ski poles  Functional Squat  10 reps;3 seconds      Knee/Hip Exercises: Supine   Bridges with Ball Squeeze  Both;Strengthening;15 reps   3" hold    Other Supine Knee/Hip Exercises  Hooklying alternating clam shell with looped red TB at knees x 10 reps               PT Short Term Goals - 03/29/18 1023      PT SHORT TERM GOAL #1   Title  Patient to be independent with initial HEP.    Time  2    Period  Weeks    Status  Achieved        PT Long Term Goals - 04/13/18 1125      PT LONG TERM GOAL #1   Title  Patient to be independent with advanced HEP.    Time  6    Period  Weeks    Status  Partially Met      PT LONG TERM GOAL #2   Title  Patient to demonstrate R hip AROM without pain limiting.     Time  6    Period  Weeks    Status  On-going      PT LONG TERM GOAL #3   Title  Patient to demonstrate B LE strength >=4+/5.    Time  6    Period  Weeks    Status  On-going      PT LONG TERM GOAL #4   Title  Patient to tolerate walking for 1 hour without pain limiting.     Baseline  able to walk  15 min- reported 04/13/18    Time  6    Period  Weeks    Status  On-going      PT LONG TERM GOAL #5   Title  Patient to report tolerance of sitting in car for 2 hours without pain limiting.    Baseline  has to continuously shift.  can tolerate ~20 min. reported 04/13/18    Time  6    Period  Weeks    Status  On-going            Plan - 04/25/18 1320    Clinical Impression Statement  Pt. noting she has upcoming MD appointment to address ongoing "lump" which is approximately the size of golf ball which is somewhat ttp however is not painful with activity on medial/distal R ankle.  Pt. tolerated progression of proximal hip strengthening and LE strengthening activities well today.  Less focus on manual therapy today as pt. noting some resolution of R buttocks pain.  Will monitor response at upcoming visit.      PT Treatment/Interventions  ADLs/Self Care Home Management;Cryotherapy;Electrical Stimulation;Iontophoresis 8m/ml Dexamethasone;Moist Heat;Ultrasound;Gait training;Stair training;Functional mobility training;Therapeutic activities;Therapeutic exercise;Manual techniques;Patient/family education;Neuromuscular re-education;Balance training;Passive range of motion;Dry needling;Energy conservation;Splinting;Taping;Vasopneumatic Device    Consulted and Agree with Plan of Care  Patient       Patient will benefit from skilled therapeutic intervention in order to improve the following deficits and impairments:  Hypomobility, Decreased activity tolerance, Decreased strength, Pain, Decreased mobility, Difficulty walking, Decreased range of motion, Postural dysfunction  Visit Diagnosis: Pain in right hip  Stiffness of right hip, not elsewhere classified  Muscle weakness (generalized)  Difficulty in walking, not elsewhere classified     Problem List Patient Active Problem List   Diagnosis Date Noted  . Abnormal uterine bleeding 09/30/2014  . Abdominal pain, epigastric 05/01/2014  .  Chronic LBP 01/11/2014  . Anxiety 06/15/2013  .  Dysmenorrhea 06/15/2013  . Blood pressure elevated 06/15/2013  . Anemia, iron deficiency 06/15/2013  . Headache, migraine 06/15/2013  . H/O gastric bypass 11/27/2012  . Hx of cesarean section 03/11/2012  . Iron deficiency 03/07/2012    Bess Harvest, PTA 04/25/18 4:03 PM   [facility] [address]  [place], [place], [address] Phone: [phone]   Fax:  [phone]  Name: Jennifer Brown MRN: [id number] Date of Birth: 03-21-1981

## 2018-04-26 ENCOUNTER — Ambulatory Visit: Payer: Medicaid Other

## 2018-04-26 DIAGNOSIS — M6281 Muscle weakness (generalized): Secondary | ICD-10-CM

## 2018-04-26 DIAGNOSIS — M25551 Pain in right hip: Secondary | ICD-10-CM | POA: Diagnosis not present

## 2018-04-26 DIAGNOSIS — M25651 Stiffness of right hip, not elsewhere classified: Secondary | ICD-10-CM

## 2018-04-26 DIAGNOSIS — R262 Difficulty in walking, not elsewhere classified: Secondary | ICD-10-CM

## 2018-04-26 NOTE — Therapy (Signed)
[facility] [address]  [place], [place], [address] Phone: [phone]   Fax:  [phone]  Physical Therapy Treatment  Patient Details  Name: Jennifer Brown MRN: [id number] Date of Birth: Jan 22, 1981 Referring Provider: Renaldo Reel, PA   Encounter Date: 04/26/2018  PT End of Session - 04/26/18 1033    Visit Number  9    Number of Visits  15    Date for PT Re-Evaluation  05/10/18    Authorization Type  Medicaid    Authorization Time Period  12 visits 8.29.19 - 10.9.19    Authorization - Visit Number  5    Authorization - Number of Visits  12    PT Start Time  1028    PT Stop Time  1100    PT Time Calculation (min)  32 min    Activity Tolerance  Patient tolerated treatment well    Behavior During Therapy  [facility] for tasks assessed/performed       Past Medical History:  Diagnosis Date  . Anemia   . Depression    no meds x 1 yr  . Hx of gastric bypass 2004  . Insomnia   . Morbid obesity ([place])   . Ovarian cyst   . Substance abuse ([facility])    Tobacco, ETOH  . Urinary tract infection     Past Surgical History:  Procedure Laterality Date  . BREAST REDUCTION SURGERY    . BREAST SURGERY  12/25/08   Reduction  . CESAREAN SECTION  2001, 2005, 2009  . CHOLECYSTECTOMY  08/09/02  . GASTRIC BYPASS  01/23/03  . HERNIA REPAIR  11/13/04  . INDUCED ABORTION      There were no vitals filed for this visit.  Subjective Assessment - 04/26/18 1032    Subjective  Pt. doing well today.      Diagnostic tests  02/18/18 R hip x ray: Healed right greater trochanteric fracture with surrounding heterotopic ossification. Anatomic alignment and position    Patient Stated Goals  be able to manage pain and tools for comfort and relief    Currently in Pain?  No/denies    Pain Score  0-No pain    Multiple Pain Sites  No                       [facility] Adult PT Treatment/Exercise - 04/26/18 1037      Knee/Hip  Exercises: Stretches   Piriformis Stretch  Right;Left;2 reps;30 seconds    Piriformis Stretch Limitations  KTOS, Figure-4      Knee/Hip Exercises: Aerobic   Nustep  Lvl 5, 5 min       Knee/Hip Exercises: Standing   Forward Step Up  Right;15 reps;1 set;Hand Hold: 1    Forward Step Up Limitations  1 ski poles     Functional Squat  15 reps;3 seconds    Wall Squat  10 reps;5 seconds    Wall Squat Limitations  ~ 70 dg flexion     Other Standing Knee Exercises  Side stepping with red TB 2 x 20 ft       Knee/Hip Exercises: Sidelying   Clams  B red TB x 10 reps  at knees                PT Short Term Goals - 03/29/18 1023      PT SHORT TERM GOAL #1   Title  Patient to be independent with initial  HEP.    Time  2    Period  Weeks    Status  Achieved        PT Long Term Goals - 04/13/18 1125      PT LONG TERM GOAL #1   Title  Patient to be independent with advanced HEP.    Time  6    Period  Weeks    Status  Partially Met      PT LONG TERM GOAL #2   Title  Patient to demonstrate R hip AROM without pain limiting.     Time  6    Period  Weeks    Status  On-going      PT LONG TERM GOAL #3   Title  Patient to demonstrate B LE strength >=4+/5.    Time  6    Period  Weeks    Status  On-going      PT LONG TERM GOAL #4   Title  Patient to tolerate walking for 1 hour without pain limiting.     Baseline  able to walk 15 min- reported 04/13/18    Time  6    Period  Weeks    Status  On-going      PT LONG TERM GOAL #5   Title  Patient to report tolerance of sitting in car for 2 hours without pain limiting.    Baseline  has to continuously shift.  can tolerate ~20 min. reported 04/13/18    Time  6    Period  Weeks    Status  On-going            Plan - 04/26/18 1033    Clinical Impression Statement  Session time limited as pt. arrived thirteen min late to session today, which she said was due to ride service.  Jennifer Brown doing well today and notes no significant LE  soreness after yesterday's visit.  Session focused on progression of proximal hip strengthening activities, which were tolerated well.  Ended session with pt. verbalizing poor recall of HEP.  Issued red looped TB to pt. for clamshell and bridge activities and strongly encouraged her to perform HEP daily for full benefit from therapy.  Will continue to monitor HEP adherence in coming visits.     PT Treatment/Interventions  ADLs/Self Care Home Management;Cryotherapy;Electrical Stimulation;Iontophoresis 51m/ml Dexamethasone;Moist Heat;Ultrasound;Gait training;Stair training;Functional mobility training;Therapeutic activities;Therapeutic exercise;Manual techniques;Patient/family education;Neuromuscular re-education;Balance training;Passive range of motion;Dry needling;Energy conservation;Splinting;Taping;Vasopneumatic Device    Consulted and Agree with Plan of Care  Patient       Patient will benefit from skilled therapeutic intervention in order to improve the following deficits and impairments:  Hypomobility, Decreased activity tolerance, Decreased strength, Pain, Decreased mobility, Difficulty walking, Decreased range of motion, Postural dysfunction  Visit Diagnosis: Pain in right hip  Stiffness of right hip, not elsewhere classified  Muscle weakness (generalized)  Difficulty in walking, not elsewhere classified     Problem List Patient Active Problem List   Diagnosis Date Noted  . Abnormal uterine bleeding 09/30/2014  . Abdominal pain, epigastric 05/01/2014  . Chronic LBP 01/11/2014  . Anxiety 06/15/2013  . Dysmenorrhea 06/15/2013  . Blood pressure elevated 06/15/2013  . Anemia, iron deficiency 06/15/2013  . Headache, migraine 06/15/2013  . H/O gastric bypass 11/27/2012  . Hx of cesarean section 03/11/2012  . Iron deficiency 03/07/2012    MBess Harvest PTA 04/26/18 12:24 PM   [facility] [address] [place] Phone: [phone]   Fax:  [phone]  Name: Jennifer Brown MRN: [id number] Date of Birth: March 17, 1981

## 2018-04-27 ENCOUNTER — Encounter: Payer: Self-pay | Admitting: Family

## 2018-04-27 ENCOUNTER — Other Ambulatory Visit: Payer: Self-pay

## 2018-04-27 ENCOUNTER — Inpatient Hospital Stay: Payer: Medicaid Other

## 2018-04-27 ENCOUNTER — Inpatient Hospital Stay: Payer: Medicaid Other | Attending: Family | Admitting: Family

## 2018-04-27 VITALS — BP 110/73 | HR 75 | Temp 98.7°F | Resp 20 | Wt 188.6 lb

## 2018-04-27 DIAGNOSIS — D500 Iron deficiency anemia secondary to blood loss (chronic): Secondary | ICD-10-CM

## 2018-04-27 DIAGNOSIS — Z79899 Other long term (current) drug therapy: Secondary | ICD-10-CM | POA: Diagnosis not present

## 2018-04-27 DIAGNOSIS — K909 Intestinal malabsorption, unspecified: Secondary | ICD-10-CM | POA: Diagnosis not present

## 2018-04-27 DIAGNOSIS — D509 Iron deficiency anemia, unspecified: Secondary | ICD-10-CM

## 2018-04-27 DIAGNOSIS — Z9884 Bariatric surgery status: Secondary | ICD-10-CM

## 2018-04-27 DIAGNOSIS — E611 Iron deficiency: Secondary | ICD-10-CM

## 2018-04-27 LAB — CBC WITH DIFFERENTIAL (CANCER CENTER ONLY)
BASOS ABS: 0 10*3/uL (ref 0.0–0.1)
BASOS PCT: 1 %
EOS ABS: 0.1 10*3/uL (ref 0.0–0.5)
Eosinophils Relative: 1 %
HCT: 27.5 % — ABNORMAL LOW (ref 34.8–46.6)
Hemoglobin: 7.7 g/dL — ABNORMAL LOW (ref 11.6–15.9)
Lymphocytes Relative: 29 %
Lymphs Abs: 1.8 10*3/uL (ref 0.9–3.3)
MCH: 19.8 pg — ABNORMAL LOW (ref 26.0–34.0)
MCHC: 28 g/dL — AB (ref 32.0–36.0)
MCV: 70.9 fL — ABNORMAL LOW (ref 81.0–101.0)
Monocytes Absolute: 0.6 10*3/uL (ref 0.1–0.9)
Monocytes Relative: 10 %
Neutro Abs: 3.6 10*3/uL (ref 1.5–6.5)
Neutrophils Relative %: 59 %
Platelet Count: 227 10*3/uL (ref 145–400)
RBC: 3.88 MIL/uL (ref 3.70–5.32)
RDW: 28.2 % — AB (ref 11.1–15.7)
WBC Count: 6 10*3/uL (ref 3.9–10.0)

## 2018-04-27 NOTE — Progress Notes (Signed)
Hematology and Oncology Follow Up Visit  JENA TEGELER [id number] Jun 29, 1981 37 y.o. 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 Sharps is here today with her daughter for follow-up. She received 1 dose of IV iron in August and no showed to her second scheduled infusion. Hgb is improved at 7.7 and MCV 70.9.  She is still fatigued, having chills and chewing lots of ice.  Her cycle continues to be heavy and she is currently on.  No other episodes of bleeding, no bruising or petechiae.  She is eating healthier with veggies including broccoli. She has noticed abdominal bloating recently and flatus. No fever, chills, n/v, cough, rash, dizziness, SOB, chest pain, palpitations, abdominal pain or changes in bowel or bladder habits.  She states that she has had some swelling in her feet and ankles. She states that she was worked up by her PCP and [place] was negative for DVT. They are watching for now.  No tenderness, numbness or tingling in her extremities.  No lymphadenopathy noted on exam.  She has a good appetite and is staying well hydrated.   ECOG Performance Status: 1 - Symptomatic but completely ambulatory  Medications:  Allergies as of 04/27/2018   No Known Allergies     Medication List        Accurate as of 04/27/18 11:46 AM. Always use your most recent med list.          ADVIL 200 MG tablet Generic drug:  ibuprofen Take 200 mg by mouth as needed. ADVIL PM FOR SLEEP   busPIRone 15 MG tablet Commonly known as:  BUSPAR TAKE 5MG  (1/3) TABLET 3 TIMES A DAY AS NEEDED FOR ANXIETY   cephALEXin 500 MG capsule Commonly known as:  KEFLEX Take 1 capsule (500 mg total) by mouth 4 (four) times daily.   diphenhydramine-acetaminophen 25-500 MG Tabs tablet Commonly known as:  TYLENOL PM Take by mouth.   folic acid 1 MG tablet Commonly  known as:  FOLVITE Take 1 tablet (1 mg total) by mouth daily.   HYDROcodone-acetaminophen 5-325 MG tablet Commonly known as:  NORCO/VICODIN Take 1 tablet by mouth every 6 (six) hours as needed.   metroNIDAZOLE 500 MG tablet Commonly known as:  FLAGYL Take 1 tablet (500 mg total) by mouth 2 (two) times daily. One po bid x 7 days   naproxen 500 MG tablet Commonly known as:  NAPROSYN Take 1 tablet (500 mg total) by mouth 2 (two) times daily.   phenazopyridine 200 MG tablet Commonly known as:  PYRIDIUM Take 1 tablet (200 mg total) by mouth 3 (three) times daily.   sertraline 25 MG tablet Commonly known as:  ZOLOFT TAKE 1 TABLET (25 MG TOTAL) BY MOUTH DAILY.   traZODone 50 MG tablet Commonly known as:  DESYREL 100 mg at bedtime as needed.       Allergies: No Known Allergies  Past Medical History, Surgical history, Social history, and Family History were reviewed and updated.  Review of Systems: All other 10 point review of systems is negative.   Physical Exam:  weight is 188 lb 9.6 oz (85.5 kg). Her oral temperature is 98.7 F (37.1 C). Her blood pressure is 110/73 and her pulse is 75. Her respiration is 20 and oxygen saturation is 100%.   Wt Readings from Last 3 Encounters:  04/27/18 188 lb 9.6  oz (85.5 kg)  03/16/18 175 lb 8 oz (79.6 kg)  04/07/17 190 lb (86.2 kg)    Ocular: Sclerae unicteric, pupils equal, round and reactive to light Ear-nose-throat: Oropharynx clear, dentition fair Lymphatic: No cervical, supraclavicular or axillary adenopathy Lungs no rales or rhonchi, good excursion bilaterally Heart regular rate and rhythm, no murmur appreciated Abd soft, nontender, positive bowel sounds, no liver or spleen tip palpated on exam, no fluid wave  MSK no focal spinal tenderness, no joint edema Neuro: non-focal, well-oriented, appropriate affect Breasts: Deferred   Lab Results  Component Value Date   WBC 6.0 04/27/2018   HGB 7.7 (L) 04/27/2018   HCT 27.5 (L)  04/27/2018   MCV 70.9 (L) 04/27/2018   PLT 227 04/27/2018   Lab Results  Component Value Date   FERRITIN <4 (L) 03/16/2018   IRON 7 (L) 03/16/2018   TIBC 499 (H) 03/16/2018   UIBC 492 03/16/2018   IRONPCTSAT 1 (L) 03/16/2018   Lab Results  Component Value Date   RETICCTPCT 1.1 03/16/2018   RBC 3.88 04/27/2018   RETICCTABS 103.0 04/12/2015   No results found for: KPAFRELGTCHN, LAMBDASER, KAPLAMBRATIO No results found for: IGGSERUM, IGA, IGMSERUM No results found for: Dorene ArOTALPROTELP, ALBUMINELP, A1GS, A2GS, Colin BentonBETS, BETA2SER, GAMS, MSPIKE, SPEI   Chemistry      Component Value Date/Time   NA 140 05/07/2015 1630   K 4.0 05/07/2015 1630   CL 108 05/07/2015 1630   CO2 26 05/07/2015 1630   BUN 18 05/07/2015 1630   CREATININE 0.64 05/07/2015 1630      Component Value Date/Time   CALCIUM 8.9 05/07/2015 1630   ALKPHOS 55 06/08/2014 1212   AST 24 06/08/2014 1212   ALT 21 06/08/2014 1212   BILITOT 0.3 06/08/2014 1212      Impression and Plan: Ms. [name] is a 37 yo African American female with iron deficiency anemia secondary to malabsorption (gastric bypass 2004) and heavy cycles. She is symptomatic as mentioned above.  We will see what her iron studies show and bring her back for infusion if needed.  We will plan to see her back in another 2 months for follow-up.  She will contact our office with any questions or concerns. We can certainly see her sooner if need be.   Emeline GinsSarah Aulani Shipton, NP 9/18/201911:46 AM

## 2018-04-28 LAB — IRON AND TIBC
IRON: 10 ug/dL — AB (ref 41–142)
Saturation Ratios: 2 % — ABNORMAL LOW (ref 21–57)
TIBC: 441 ug/dL (ref 236–444)
UIBC: 431 ug/dL

## 2018-04-28 LAB — FERRITIN

## 2018-05-02 ENCOUNTER — Telehealth: Payer: Self-pay | Admitting: Hematology & Oncology

## 2018-05-02 NOTE — Telephone Encounter (Signed)
[facility] [place] P: [phone] F: [phone]  CASE: [id number]    COPY SCANNED

## 2018-05-03 ENCOUNTER — Encounter: Payer: Self-pay | Admitting: Physical Therapy

## 2018-05-03 ENCOUNTER — Ambulatory Visit: Payer: Medicaid Other | Admitting: Physical Therapy

## 2018-05-03 DIAGNOSIS — M25551 Pain in right hip: Secondary | ICD-10-CM

## 2018-05-03 DIAGNOSIS — M25651 Stiffness of right hip, not elsewhere classified: Secondary | ICD-10-CM

## 2018-05-03 DIAGNOSIS — M6281 Muscle weakness (generalized): Secondary | ICD-10-CM

## 2018-05-03 DIAGNOSIS — R262 Difficulty in walking, not elsewhere classified: Secondary | ICD-10-CM

## 2018-05-03 NOTE — Therapy (Signed)
[facility] [address]  [place], [place], [address] Phone: [phone]   Fax:  [phone]  Physical Therapy Progress Note  Patient Details  Name: Jennifer Brown MRN: [id number] Date of Birth: 08-03-1981 Referring Provider: Renaldo Reel, PA   Encounter Date: 05/03/2018  PT End of Session - 05/03/18 1100    Visit Number  10    Number of Visits  15    Date for PT Re-Evaluation  05/10/18    Authorization Type  Medicaid    Authorization Time Period  12 visits 8.29.19 - 10.9.19    Authorization - Visit Number  6    Authorization - Number of Visits  12    PT Start Time  0175    PT Stop Time  1110    PT Time Calculation (min)  56 min    Activity Tolerance  Patient tolerated treatment well;Patient limited by pain;Patient limited by fatigue;Other (comment)   patient emotional today   Behavior During Therapy  WFL for tasks assessed/performed       Past Medical History:  Diagnosis Date  . Anemia   . Depression    no meds x 1 yr  . Hx of gastric bypass 2004  . Insomnia   . Morbid obesity ([place])   . Ovarian cyst   . Substance abuse ([facility])    Tobacco, ETOH  . Urinary tract infection     Past Surgical History:  Procedure Laterality Date  . BREAST REDUCTION SURGERY    . BREAST SURGERY  12/25/08   Reduction  . CESAREAN SECTION  2001, 2005, 2009  . CHOLECYSTECTOMY  08/09/02  . GASTRIC BYPASS  01/23/03  . HERNIA REPAIR  11/13/04  . INDUCED ABORTION      There were no vitals filed for this visit.  Subjective Assessment - 05/03/18 1016    Subjective  Reports she is overall doing well with her hip. A little tired today. Reports 65-70% improvement since initial eval. Notes increased comfort in squatting, flexibility. Reports hip hurts more depending on the weather. Still having trouble with muscle spasms. Reporting she has been more consistent with HEP since last session. Upon questioning, patient reveal that she  is feeling depressed since her divorce became final last month. Has not been taking good care of herself- not eating enough and losing weight, not hydrating, drinking too much.     Diagnostic tests  02/18/18 R hip x ray: Healed right greater trochanteric fracture with surrounding heterotopic ossification. Anatomic alignment and position    Patient Stated Goals  be able to manage pain and tools for comfort and relief    Currently in Pain?  Yes    Pain Location  Head    Pain Orientation  Anterior    Pain Descriptors / Indicators  Throbbing    Pain Type  Acute pain         [facility] PT Assessment - 05/03/18 0001      AROM   Right Hip Flexion  108    Right Hip External Rotation   38    Right Hip Internal Rotation   5    Right Hip ABduction  36      Strength   Right/Left Hip  Right;Left    Right Hip Flexion  4/5    Right Hip ABduction  4/5    Right Hip ADduction  4/5    Left Hip Flexion  4/5    Left Hip ABduction  4+/5    Left Hip ADduction  4+/5    Right/Left Knee  Left;Right    Right Knee Flexion  4/5    Right Knee Extension  4/5    Left Knee Flexion  4+/5    Left Knee Extension  4+/5    Right/Left Ankle  Right;Left    Right Ankle Dorsiflexion  4/5    Right Ankle Plantar Flexion  4+/5    Left Ankle Dorsiflexion  4/5    Left Ankle Plantar Flexion  4+/5                   [facility] Adult PT Treatment/Exercise - 05/03/18 0001      Knee/Hip Exercises: Stretches   Other Knee/Hip Stretches  HS stretch with strap 30" each LE      Knee/Hip Exercises: Aerobic   Nustep  Lvl 4, 6 min    discontinued at 5:30 min d/t R LE pain     Knee/Hip Exercises: Supine   Bridges with Clamshell  Strengthening;Both;Limitations;15 reps   red TB around knees     Knee/Hip Exercises: Sidelying   Clams  15x each side B red TB at knees    heavy form correction     Acupuncturist Stimulation Location  R posterolateral hip    Electrical Stimulation Action  IFC     Electrical Stimulation Parameters  Intensity: 25 to tolerance; 15 min    Electrical Stimulation Goals  Pain;Tone             PT Education - 05/03/18 1059    Education Details  heavy EDU on self-care including hydration, daily exercise, avoiding excessive alcohol intake to control mental health symptoms; administered suicide screen    Person(s) Educated  Patient    Methods  Explanation;Demonstration;Tactile cues;Verbal cues;Handout    Comprehension  Verbalized understanding;Returned demonstration       PT Short Term Goals - 05/03/18 1019      PT SHORT TERM GOAL #1   Title  Patient to be independent with initial HEP.    Time  2    Period  Weeks    Status  Achieved        PT Long Term Goals - 05/03/18 1019      PT LONG TERM GOAL #1   Title  Patient to be independent with advanced HEP.    Baseline  reporting inconsistent compliance with HEP d/t recent depressive episode 05/03/18    Time  6    Period  Weeks    Status  Partially Met      PT LONG TERM GOAL #2   Title  Patient to demonstrate R hip AROM without pain limiting.     Baseline  improvements noted in R hip flexion and abduction with no c/o pain 05/03/18    Time  6    Period  Weeks    Status  On-going      PT LONG TERM GOAL #3   Title  Patient to demonstrate B LE strength >=4+/5.    Baseline  improvements noted in R hip flexion and abduction, B knee extension, R knee flexion, and R ankle DF & PF 05/03/18    Time  6    Period  Weeks    Status  Partially Met      PT LONG TERM GOAL #4   Title  Patient to tolerate walking for 1 hour without pain limiting.     Baseline  able to walk 15  min- reported 05/03/18    Time  6    Period  Weeks    Status  On-going      PT LONG TERM GOAL #5   Title  Patient to report tolerance of sitting in car for 2 hours without pain limiting.    Baseline  has to continuously shift.  can tolerate ~20 min. reported 05/03/18    Time  6    Period  Weeks    Status  On-going             Plan - 05/03/18 1207    Clinical Impression Statement  Patient arrived to session with report of HA. Reported 65-70% improvement in R hip since initial eval. Noting improvements in comfort with squatting and improved flexibility, however still having intermittent muscle spasms. Patient reported improved compliance with HEP since last session, however participation with HEP has been intermittent d/t patient's report of depression. Updated goals-improvements noted in R hip flexion and abduction AROM with no c/o pain with hip ROM today. Improvements noted in R hip flexion and abduction, B knee extension, R knee flexion, and R ankle DF & PF strength. Patient still limited in sitting and walking tolerance at this time d/t pain. After some discussion, patient became emotional and revealed that she has not been taking good care of herself lately- not eating enough and losing weight, not hydrating, drinking too much due to depressive episode caused by recent divorce. Patient denied suicidal thoughts on suicide screen questionnaire. Offered counseling- patient reported interest. Spend considerable amount of time this session educating patient on proper physical self-care including importance of hydration, daily exercise, avoiding excessive alcohol intake to control mental health symptoms. Patient distracted by phone during end of session when instructing patient on LE ther-ex for stretching and strengthening. Heavy cues required to correct form. Patient reporting pain in R hip at end of session which was addressed with e-stim to L posterolateral hip. Report of relief at conclusion of session. Patient showing good ROM and strengthen improvements, may benefit from counseling for mental health.     PT Treatment/Interventions  ADLs/Self Care Home Management;Cryotherapy;Electrical Stimulation;Iontophoresis 24m/ml Dexamethasone;Moist Heat;Ultrasound;Gait training;Stair training;Functional mobility  training;Therapeutic activities;Therapeutic exercise;Manual techniques;Patient/family education;Neuromuscular re-education;Balance training;Passive range of motion;Dry needling;Energy conservation;Splinting;Taping;Vasopneumatic Device    Consulted and Agree with Plan of Care  Patient       Patient will benefit from skilled therapeutic intervention in order to improve the following deficits and impairments:  Hypomobility, Decreased activity tolerance, Decreased strength, Pain, Decreased mobility, Difficulty walking, Decreased range of motion, Postural dysfunction  Visit Diagnosis: Pain in right hip  Stiffness of right hip, not elsewhere classified  Muscle weakness (generalized)  Difficulty in walking, not elsewhere classified     Problem List Patient Active Problem List   Diagnosis Date Noted  . Abnormal uterine bleeding 09/30/2014  . Abdominal pain, epigastric 05/01/2014  . Chronic LBP 01/11/2014  . Anxiety 06/15/2013  . Dysmenorrhea 06/15/2013  . Blood pressure elevated 06/15/2013  . Anemia, iron deficiency 06/15/2013  . Headache, migraine 06/15/2013  . H/O gastric bypass 11/27/2012  . Hx of cesarean section 03/11/2012  . Iron deficiency 03/07/2012    YJanene Harvey PT, DPT 05/03/18 12:21 PM   [facility] [address] [place] [address]Phone: [phone]  Fax:  [phone] Name: Jennifer HOLLARMRN: [id number] of Birth: [birth year]

## 2018-05-03 NOTE — Patient Instructions (Addendum)
Screening for Suicide  Answer the following questions with Yes or No and place an "x" beside the action taken.  1. Over the past two weeks, have you felt down, depressed, or hopeless?   Yes  2. Within the past two weeks, have you felt little interest or pleasure in life?  Yes  If YES to either #1 or #2, then ask #3  3. Have you had thoughts that that life is not worth living or that you might be       better off dead?   No  If answer is NO and suspicion is low, then end   4. Over this past week, have you had any thoughts about hurting or even killing yourself?    If NO, then end. Patient in no immediate danger   5. If so, do you believe that you intend to or will harm yourself?       If NO, then end. Patient in no immediate danger   6.  Do you have a plan as to how you would hurt yourself?     7.  Over this past week, have you actually done anything to hurt yourself?    IF YES answers to either #4, #5, #6 or #7, then patient is AT RISK for suicide   Actions Taken  __x__  Screening negative; no further action required  ____  Screening positive; no immediate danger and patient already in treatment with a  mental health provider. Advise patient to speak to their mental health provider.  ____  Screening positive; no immediate danger. Patient advised to contact a mental  health provider for further assessment.   ____  Screening positive; in immediate danger as patient states intention of killing self,  has plan and a sense of imminence. Do not leave alone. Seek permission from  patient to contact a family member to inform them. Direct patient to go to ED.

## 2018-05-05 ENCOUNTER — Ambulatory Visit: Payer: Medicaid Other

## 2018-05-05 DIAGNOSIS — M6281 Muscle weakness (generalized): Secondary | ICD-10-CM

## 2018-05-05 DIAGNOSIS — M25551 Pain in right hip: Secondary | ICD-10-CM

## 2018-05-05 DIAGNOSIS — M25651 Stiffness of right hip, not elsewhere classified: Secondary | ICD-10-CM

## 2018-05-05 DIAGNOSIS — R262 Difficulty in walking, not elsewhere classified: Secondary | ICD-10-CM

## 2018-05-05 NOTE — Therapy (Addendum)
[facility] [address]  [place], [place], [address] Phone: [phone]   Fax:  [phone]  Physical Therapy Treatment  Patient Details  Name: Jennifer Brown MRN: [id number] Date of Birth: 1980-12-16 Referring Provider (PT): Renaldo Reel, [place]   Encounter Date: 05/05/2018  PT End of Session - 05/05/18 1021    Visit Number  11    Number of Visits  15    Date for PT Re-Evaluation  05/10/18    Authorization Type  Medicaid    Authorization Time Period  12 visits 8.29.19 - 10.9.19    Authorization - Visit Number  7    Authorization - Number of Visits  12    PT Start Time  8264    PT Stop Time  1056    PT Time Calculation (min)  38 min    Activity Tolerance  Patient tolerated treatment well;Patient limited by pain;Patient limited by fatigue;Other (comment)   patient emotional today   Behavior During Therapy  WFL for tasks assessed/performed       Past Medical History:  Diagnosis Date  . Anemia   . Depression    no meds x 1 yr  . Hx of gastric bypass 2004  . Insomnia   . Morbid obesity ([place])   . Ovarian cyst   . Substance abuse ([facility])    Tobacco, ETOH  . Urinary tract infection     Past Surgical History:  Procedure Laterality Date  . BREAST REDUCTION SURGERY    . BREAST SURGERY  12/25/08   Reduction  . CESAREAN SECTION  2001, 2005, 2009  . CHOLECYSTECTOMY  08/09/02  . GASTRIC BYPASS  01/23/03  . HERNIA REPAIR  11/13/04  . INDUCED ABORTION      There were no vitals filed for this visit.  Subjective Assessment - 05/05/18 1022    Subjective  Pt. noting her mother had a stroke on yesterday and she is very distracted today.      How long can you stand comfortably?  1.5-2 hours    How long can you walk comfortably?  30-72mn (limited by spasms)    Diagnostic tests  02/18/18 R hip x ray: Healed right greater trochanteric fracture with surrounding heterotopic ossification. Anatomic alignment and  position    Patient Stated Goals  be able to manage pain and tools for comfort and relief    Currently in Pain?  No/denies    Pain Score  0-No pain    Multiple Pain Sites  No                       [facility] Adult PT Treatment/Exercise - 05/05/18 1028      Knee/Hip Exercises: Stretches   Piriformis Stretch  Right;Left;2 reps;30 seconds    Piriformis Stretch Limitations  KTOS       Knee/Hip Exercises: Aerobic   Stationary Bike  L2 x 6 min       Knee/Hip Exercises: Standing   Hip Flexion  Right;Left;10 reps;Stengthening    Hip Flexion Limitations  red TB at ankle; 2 ski poles     Hip ADduction  Right;Left;10 reps;Strengthening    Hip ADduction Limitations  red TB at ankle; 2 ski poles     Hip Abduction  Right;Left;10 reps;Stengthening    Abduction Limitations  red TB at ankle; 2 ski poles     Hip Extension  Right;Both;10 reps;Stengthening    Extension Limitations  red TB at  ankles; 2 ski poles       Knee/Hip Exercises: Seated   Sit to Sand  15 reps   with B hip abd/ER isometrics into red looped TB at knees      Knee/Hip Exercises: Sidelying   Clams  15x each side B red TB at knees              PT Education - 05/05/18 1320    Education Details  HEP update     Person(s) Educated  Patient    Methods  Explanation;Demonstration;Verbal cues;Handout    Comprehension  Verbalized understanding;Returned demonstration;Verbal cues required;Need further instruction       PT Short Term Goals - 05/03/18 1019      PT SHORT TERM GOAL #1   Title  Patient to be independent with initial HEP.    Time  2    Period  Weeks    Status  Achieved        PT Long Term Goals - 05/03/18 1019      PT LONG TERM GOAL #1   Title  Patient to be independent with advanced HEP.    Baseline  reporting inconsistent compliance with HEP d/t recent depressive episode 05/03/18    Time  6    Period  Weeks    Status  Partially Met      PT LONG TERM GOAL #2   Title  Patient to  demonstrate R hip AROM without pain limiting.     Baseline  improvements noted in R hip flexion and abduction with no c/o pain 05/03/18    Time  6    Period  Weeks    Status  On-going      PT LONG TERM GOAL #3   Title  Patient to demonstrate B LE strength >=4+/5.    Baseline  improvements noted in R hip flexion and abduction, B knee extension, R knee flexion, and R ankle DF & PF 05/03/18    Time  6    Period  Weeks    Status  Partially Met      PT LONG TERM GOAL #4   Title  Patient to tolerate walking for 1 hour without pain limiting.     Baseline  able to walk 15 min- reported 05/03/18    Time  6    Period  Weeks    Status  On-going      PT LONG TERM GOAL #5   Title  Patient to report tolerance of sitting in car for 2 hours without pain limiting.    Baseline  has to continuously shift.  can tolerate ~20 min. reported 05/03/18    Time  6    Period  Weeks    Status  On-going            Plan - 05/05/18 1024    Clinical Impression Statement  Pt. noting ~ 80% improvement in pain since starting therapy.  Pt. noting she can tolerate sitting and driving for 30 min.  Pt. verbalized today that she continues to struggle with depression and was distracted throughout session today as she notes mother had a stroke yesterday.  Session focused on proximal hip strengthening for improved tolerance for functional activities such as prolonged standing and walking.  Tolerated addition of 4-way hip kicker with red TB resistance well today.  Frequent cueing required today to keep pt. on task and encourage her to finish activities.  Ended visit with pt. verbalizing fatigue however denying pain.  PT Treatment/Interventions  ADLs/Self Care Home Management;Cryotherapy;Electrical Stimulation;Iontophoresis 1m/ml Dexamethasone;Moist Heat;Ultrasound;Gait training;Stair training;Functional mobility training;Therapeutic activities;Therapeutic exercise;Manual techniques;Patient/family education;Neuromuscular  re-education;Balance training;Passive range of motion;Dry needling;Energy conservation;Splinting;Taping;Vasopneumatic Device    Consulted and Agree with Plan of Care  Patient       Patient will benefit from skilled therapeutic intervention in order to improve the following deficits and impairments:  Hypomobility, Decreased activity tolerance, Decreased strength, Pain, Decreased mobility, Difficulty walking, Decreased range of motion, Postural dysfunction  Visit Diagnosis: Pain in right hip  Stiffness of right hip, not elsewhere classified  Muscle weakness (generalized)  Difficulty in walking, not elsewhere classified     Problem List Patient Active Problem List   Diagnosis Date Noted  . Abnormal uterine bleeding 09/30/2014  . Abdominal pain, epigastric 05/01/2014  . Chronic LBP 01/11/2014  . Anxiety 06/15/2013  . Dysmenorrhea 06/15/2013  . Blood pressure elevated 06/15/2013  . Anemia, iron deficiency 06/15/2013  . Headache, migraine 06/15/2013  . H/O gastric bypass 11/27/2012  . Hx of cesarean section 03/11/2012  . Iron deficiency 03/07/2012   MBess Harvest PTA 05/05/18 1:20 PM   [facility] [place] [address]Phone: [phone]  Fax:  [phone] Name: EDESTYNI HOPPELMRN: [id number] of Birth: 101-23-1982

## 2018-05-10 ENCOUNTER — Ambulatory Visit: Payer: Medicaid Other | Attending: Physician Assistant

## 2018-05-10 DIAGNOSIS — R262 Difficulty in walking, not elsewhere classified: Secondary | ICD-10-CM

## 2018-05-10 DIAGNOSIS — M6281 Muscle weakness (generalized): Secondary | ICD-10-CM

## 2018-05-10 DIAGNOSIS — M25651 Stiffness of right hip, not elsewhere classified: Secondary | ICD-10-CM | POA: Diagnosis present

## 2018-05-10 DIAGNOSIS — M25551 Pain in right hip: Secondary | ICD-10-CM

## 2018-05-10 NOTE — Therapy (Signed)
[facility] [address]  [place], [place], [address] Phone: [phone]   Fax:  [phone]  Physical Therapy Treatment  Patient Details  Name: Jennifer Brown MRN: [id number] Date of Birth: [birth year] Referring Provider (PT): Renaldo Reel, [place]   Encounter Date: 05/10/2018  PT End of Session - 05/10/18 1026    Visit Number  12    Number of Visits  15    Date for PT Re-Evaluation  05/10/18    Authorization Type  Medicaid    Authorization Time Period  12 visits 8.29.19 - 10.9.19    Authorization - Visit Number  8    Authorization - Number of Visits  12    PT Start Time  1021    PT Stop Time  1100    PT Time Calculation (min)  39 min    Activity Tolerance  Patient tolerated treatment well    Behavior During Therapy  [facility] for tasks assessed/performed       Past Medical History:  Diagnosis Date  . Anemia   . Depression    no meds x 1 yr  . Hx of gastric bypass 2004  . Insomnia   . Morbid obesity ([place])   . Ovarian cyst   . Substance abuse ([facility])    Tobacco, ETOH  . Urinary tract infection     Past Surgical History:  Procedure Laterality Date  . BREAST REDUCTION SURGERY    . BREAST SURGERY  12/25/08   Reduction  . CESAREAN SECTION  2001, 2005, 2009  . CHOLECYSTECTOMY  08/09/02  . GASTRIC BYPASS  01/23/03  . HERNIA REPAIR  11/13/04  . INDUCED ABORTION      There were no vitals filed for this visit.  Subjective Assessment - 05/10/18 1024    Subjective  Pt. noting she feels better today however mother is still in hospital.      How long can you sit comfortably?  Notes 2 hours sitting in car tolerance     How long can you stand comfortably?  1.5-2 hours    How long can you walk comfortably?  45 min     Diagnostic tests  02/18/18 R hip x ray: Healed right greater trochanteric fracture with surrounding heterotopic ossification. Anatomic alignment and position    Patient Stated Goals  be able to manage  pain and tools for comfort and relief    Currently in Pain?  No/denies    Pain Score  0-No pain    Multiple Pain Sites  No         [facility] PT Assessment - 05/10/18 1048      AROM   Right Hip Flexion  110   soft tissue restriction    Right Hip External Rotation   43    Right Hip Internal Rotation   9    Right Hip ABduction  50                   [facility] Adult PT Treatment/Exercise - 05/10/18 1036      Knee/Hip Exercises: Aerobic   Nustep  Lvl 4, 7 min       Knee/Hip Exercises: Standing   Hip Flexion  Right;Left;10 reps;Stengthening    Hip Flexion Limitations  red TB at ankle; 1 ski poles     Hip ADduction  Right;Left;10 reps;Strengthening    Hip ADduction Limitations  red TB at ankle; 1 ski poles     Hip Abduction  Right;Left;10 reps;Stengthening    Abduction Limitations  red TB at ankle; 1 ski poles     Hip Extension  Right;Both;10 reps;Stengthening    Extension Limitations  red TB at ankles; 1 ski poles       Knee/Hip Exercises: Supine   Straight Leg Raises  Right;10 reps;Strengthening    Straight Leg Raises Limitations  1#      Knee/Hip Exercises: Sidelying   Hip ABduction  Right;10 reps;Strengthening    Hip ABduction Limitations  1#    Hip ADduction  Right;10 reps;Strengthening    Hip ADduction Limitations  1#       Knee/Hip Exercises: Prone   Other Prone Exercises  prone hip extension 1# x 10 reps                PT Short Term Goals - 05/03/18 1019      PT SHORT TERM GOAL #1   Title  Patient to be independent with initial HEP.    Time  2    Period  Weeks    Status  Achieved        PT Long Term Goals - 05/10/18 1031      PT LONG TERM GOAL #1   Title  Patient to be independent with advanced HEP.    Baseline  --    Time  6    Period  Weeks    Status  Partially Met      PT LONG TERM GOAL #2   Title  Patient to demonstrate R hip AROM without pain limiting.     Baseline  --    Time  6    Period  Weeks    Status  Partially Met       PT LONG TERM GOAL #3   Title  Patient to demonstrate B LE strength >=4+/5.    Baseline  --    Time  6    Period  Weeks    Status  Partially Met      PT LONG TERM GOAL #4   Title  Patient to tolerate walking for 1 hour without pain limiting.     Baseline  45 min     Time  6    Period  Weeks    Status  Partially Met      PT LONG TERM GOAL #5   Title  Patient to report tolerance of sitting in car for 2 hours without pain limiting.    Baseline  --    Time  6    Period  Weeks    Status  Achieved            Plan - 05/10/18 1030    Clinical Impression Statement  Pt. noting improvement overall of ~ 85% since starting therapy.  Able to partially achieve or fully achieve all therapy goals at this point.  Notes she has been performing updated HEP to target remaining hip strength weaknesses.  After discussion, pt. verbalizing that she is ok planning on transitioning to home program following next visit.  Will plan on final goal testing and HEP update at upcoming visit.      PT Treatment/Interventions  ADLs/Self Care Home Management;Cryotherapy;Electrical Stimulation;Iontophoresis 25m/ml Dexamethasone;Moist Heat;Ultrasound;Gait training;Stair training;Functional mobility training;Therapeutic activities;Therapeutic exercise;Manual techniques;Patient/family education;Neuromuscular re-education;Balance training;Passive range of motion;Dry needling;Energy conservation;Splinting;Taping;Vasopneumatic Device    Consulted and Agree with Plan of Care  Patient       Patient will benefit from skilled therapeutic intervention in order to improve the following deficits  and impairments:  Hypomobility, Decreased activity tolerance, Decreased strength, Pain, Decreased mobility, Difficulty walking, Decreased range of motion, Postural dysfunction  Visit Diagnosis: Pain in right hip  Stiffness of right hip, not elsewhere classified  Muscle weakness (generalized)  Difficulty in walking, not elsewhere  classified     Problem List Patient Active Problem List   Diagnosis Date Noted  . Abnormal uterine bleeding 09/30/2014  . Abdominal pain, epigastric 05/01/2014  . Chronic LBP 01/11/2014  . Anxiety 06/15/2013  . Dysmenorrhea 06/15/2013  . Blood pressure elevated 06/15/2013  . Anemia, iron deficiency 06/15/2013  . Headache, migraine 06/15/2013  . H/O gastric bypass 11/27/2012  . Hx of cesarean section 03/11/2012  . Iron deficiency 03/07/2012    Bess Harvest, PTA 05/10/18 12:03 PM   [facility] [address]  [place], [place], [address] Phone: [phone]   Fax:  [phone]  Name: Jennifer Brown MRN: [id number] Date of Birth: 11-12-1980

## 2018-05-12 ENCOUNTER — Ambulatory Visit: Payer: Self-pay

## 2018-05-12 ENCOUNTER — Ambulatory Visit: Payer: Medicaid Other | Admitting: Physical Therapy

## 2018-05-17 ENCOUNTER — Ambulatory Visit: Payer: Medicaid Other | Admitting: Physical Therapy

## 2018-05-18 ENCOUNTER — Ambulatory Visit: Payer: Medicaid Other | Admitting: Physical Therapy

## 2018-05-18 DIAGNOSIS — M25551 Pain in right hip: Secondary | ICD-10-CM | POA: Diagnosis not present

## 2018-05-18 DIAGNOSIS — M6281 Muscle weakness (generalized): Secondary | ICD-10-CM

## 2018-05-18 DIAGNOSIS — R262 Difficulty in walking, not elsewhere classified: Secondary | ICD-10-CM

## 2018-05-18 DIAGNOSIS — M25651 Stiffness of right hip, not elsewhere classified: Secondary | ICD-10-CM
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
## 2018-05-19 ENCOUNTER — Inpatient Hospital Stay: Payer: Medicaid Other | Attending: Family

## 2018-05-19 VITALS — BP 118/65 | HR 74 | Temp 97.8°F | Resp 17

## 2018-05-19 DIAGNOSIS — N946 Dysmenorrhea, unspecified: Secondary | ICD-10-CM

## 2018-05-19 DIAGNOSIS — D509 Iron deficiency anemia, unspecified: Secondary | ICD-10-CM | POA: Diagnosis not present

## 2018-05-19 DIAGNOSIS — D500 Iron deficiency anemia secondary to blood loss (chronic): Secondary | ICD-10-CM

## 2018-05-19 MED ORDER — SODIUM CHLORIDE 0.9 % IV SOLN
Freq: Once | INTRAVENOUS | Status: AC
  Administered 2018-05-19: 12:00:00 via INTRAVENOUS
  Filled 2018-05-19: qty 250

## 2018-05-19 MED ORDER — SODIUM CHLORIDE 0.9 % IV SOLN
510.0000 mg | Freq: Once | INTRAVENOUS | Status: AC
  Administered 2018-05-19: 510 mg via INTRAVENOUS
  Filled 2018-05-19: qty 17

## 2018-05-19 NOTE — Patient Instructions (Signed)

## 2018-05-19 NOTE — Progress Notes (Signed)
Patient reports feeling well after iron, declined to stay for 30 mins post admin. VS taken and patient given a copy of AVS.

## 2018-05-24 ENCOUNTER — Encounter: Payer: Self-pay | Admitting: Physical Therapy

## 2018-05-24 ENCOUNTER — Ambulatory Visit: Payer: Medicaid Other | Admitting: Physical Therapy

## 2018-05-24 DIAGNOSIS — M6281 Muscle weakness (generalized): Secondary | ICD-10-CM

## 2018-05-24 DIAGNOSIS — R262 Difficulty in walking, not elsewhere classified: Secondary | ICD-10-CM

## 2018-05-24 DIAGNOSIS — M25651 Stiffness of right hip, not elsewhere classified: Secondary | ICD-10-CM

## 2018-05-24 DIAGNOSIS — M25551 Pain in right hip: Secondary | ICD-10-CM | POA: Diagnosis not present

## 2018-05-24 NOTE — Therapy (Signed)
[facility] [address]  [place], [place], [address] Phone: [phone]   Fax:  [phone]  Physical Therapy Treatment  Patient Details  Name: Jennifer Brown MRN: [id number] Date of Birth: 06/25/1981 Referring Provider (PT): Renaldo Reel, [place]   Encounter Date: 05/24/2018  PT End of Session - 05/24/18 1320    Visit Number  14    Number of Visits  15    Date for PT Re-Evaluation  06/15/18    Authorization Type  Medicaid    Authorization Time Period  4 visits from 05/24/18 - 06/20/18    Authorization - Visit Number  1    Authorization - Number of Visits  4    PT Start Time  1315    PT Stop Time  1354    PT Time Calculation (min)  39 min    Activity Tolerance  Patient tolerated treatment well    Behavior During Therapy  [facility] for tasks assessed/performed       Past Medical History:  Diagnosis Date  . Anemia   . Depression    no meds x 1 yr  . Hx of gastric bypass 2004  . Insomnia   . Morbid obesity ([place])   . Ovarian cyst   . Substance abuse (HCC)    Tobacco, ETOH  . Urinary tract infection     Past Surgical History:  Procedure Laterality Date  . BREAST REDUCTION SURGERY    . BREAST SURGERY  12/25/08   Reduction  . CESAREAN SECTION  2001, 2005, 2009  . CHOLECYSTECTOMY  08/09/02  . GASTRIC BYPASS  01/23/03  . HERNIA REPAIR  11/13/04  . INDUCED ABORTION      There were no vitals filed for this visit.  Subjective Assessment - 05/24/18 1313    Subjective  Patient presents on time, still eating lunch. Reports she has been on the go all day. Patient reports she has been doing better since last session. Has been using warm compresses and that has helped.     Diagnostic tests  02/18/18 R hip x ray: Healed right greater trochanteric fracture with surrounding heterotopic ossification. Anatomic alignment and position    Patient Stated Goals  be able to manage pain and tools for comfort and relief    Currently in Pain?  No/denies                       [facility] Adult PT Treatment/Exercise - 05/24/18 0001      Exercises   Exercises  Knee/Hip      Knee/Hip Exercises: Stretches   Quad Stretch  Right;Left;1 rep;30 seconds;Limitations    Sports administrator Limitations  prone strap    Hip Flexor Stretch  Right;Left;2 reps;30 seconds   cues to avoid pushing into pain   Hip Flexor Stretch Limitations  mod thomas strap    Piriformis Stretch  Right;Left;2 reps;30 seconds    Piriformis Stretch Limitations  KTOS       Knee/Hip Exercises: Aerobic   Stationary Bike  L2 x 6 min    had to lower to L1 d/t B anterior thigh pain     Knee/Hip Exercises: Standing   Hip Extension  Right;Both;Stengthening;15 reps;Knee straight    Extension Limitations  red TB at ankles; 2 ski poles    cues to maintain knee straight   Lateral Step Up  Right;1 set;10 reps;Hand Hold: 1;Step Height: 8";Limitations    Lateral Step Up Limitations  cues for slow eccentric lower; 1 ski pole    Forward Step Up  Right;1 set;10 reps;Hand Hold: 1;Step Height: 8";Limitations    Forward Step Up Limitations  good form; no pain; 1 ski pole    Functional Squat  1 set;15 reps;Limitations    Functional Squat Limitations  at counter top; cues for glute contraction      Knee/Hip Exercises: Supine   Bridges with Diona Foley Squeeze  Both;Strengthening;2 sets;10 reps    Bridges with Clamshell  Strengthening;Both;2 sets;10 reps;Limitations   red TB around knees     Knee/Hip Exercises: Sidelying   Clams  15x each side B red TB at knees    good form              PT Short Term Goals - 05/18/18 1207      PT SHORT TERM GOAL #1   Title  Patient to be independent with initial HEP.    Time  2    Period  Weeks    Status  Achieved        PT Long Term Goals - 05/18/18 1207      PT LONG TERM GOAL #1   Title  Patient to be independent with advanced HEP.    Baseline  reports inconsistent compliance with HEP    Time  6     Period  Weeks    Status  Partially Met      PT LONG TERM GOAL #2   Title  Patient to demonstrate R hip AROM without pain limiting.     Baseline  recent set back is limiting her hip flexion to 70 deg due to pain.     Time  6    Period  Weeks    Status  Partially Met      PT LONG TERM GOAL #3   Title  Patient to demonstrate B LE strength >=4+/5.    Baseline  now 4+/5 bilat grossly    Time  6    Period  Weeks    Status  Achieved      PT LONG TERM GOAL #4   Title  Patient to tolerate walking for 1 hour without pain limiting.     Baseline  45 min     Time  6    Period  Weeks    Status  Partially Met      PT LONG TERM GOAL #5   Title  Patient to report tolerance of sitting in car for 2 hours without pain limiting.    Baseline  improved, now can tolerate one hour.     Time  6    Period  Weeks    Status  Partially Met            Plan - 05/24/18 1355    Clinical Impression Statement  Patient arrived to session on time, still eating lunch. Reports she has been on the go all day today. Reports R hip pain has improved since last session- has been using warm compresses. Report of B anterior thigh pain with bike which was eased by modified Thomas stretch. Focused today's session on glute strengthening. Patient with good carryover of technique with bridges and clamshells today. Reported muscle soreness after clamshells which was eased by LE stretching. Good form with anterior and lateral step ups on 8" step- patient requiring minimal cues for slow eccentric lower. Advised patient to increase consistency of HEP for most benefit. Patient reported understanding.     PT Treatment/Interventions  ADLs/Self Care Home Management;Cryotherapy;Electrical Stimulation;Iontophoresis 17m/ml Dexamethasone;Moist Heat;Ultrasound;Gait training;Stair training;Functional mobility training;Therapeutic activities;Therapeutic exercise;Manual techniques;Patient/family education;Neuromuscular re-education;Balance  training;Passive range of motion;Dry needling;Energy conservation;Splinting;Taping;Vasopneumatic Device    Consulted and Agree with Plan of Care  Patient       Patient will benefit from skilled therapeutic intervention in order to improve the following deficits and impairments:  Hypomobility, Decreased activity tolerance, Decreased strength, Pain, Decreased mobility, Difficulty walking, Decreased range of motion, Postural dysfunction  Visit Diagnosis: Pain in right hip  Stiffness of right hip, not elsewhere classified  Muscle weakness (generalized)  Difficulty in walking, not elsewhere classified     Problem List Patient Active Problem List   Diagnosis Date Noted  . Abnormal uterine bleeding 09/30/2014  . Abdominal pain, epigastric 05/01/2014  . Chronic LBP 01/11/2014  . Anxiety 06/15/2013  . Dysmenorrhea 06/15/2013  . Blood pressure elevated 06/15/2013  . Anemia, iron deficiency 06/15/2013  . Headache, migraine 06/15/2013  . H/O gastric bypass 11/27/2012  . Hx of cesarean section 03/11/2012  . Iron deficiency 03/07/2012    YJanene Harvey PT, DPT 05/24/18 1:57 PM   [facility] [address] [place] [address]Phone: [phone]  Fax:  [phone] Name: Jennifer GOETSCHMRN: [id number] of Birth: 1Oct 10, 1982

## 2018-05-31 ENCOUNTER — Ambulatory Visit: Payer: Medicaid Other

## 2018-05-31 DIAGNOSIS — M25551 Pain in right hip: Secondary | ICD-10-CM

## 2018-05-31 DIAGNOSIS — M25651 Stiffness of right hip, not elsewhere classified: Secondary | ICD-10-CM

## 2018-05-31 DIAGNOSIS — M6281 Muscle weakness (generalized): Secondary | ICD-10-CM

## 2018-05-31 DIAGNOSIS — R262 Difficulty in walking, not elsewhere classified: Secondary | ICD-10-CM

## 2018-05-31 NOTE — Therapy (Addendum)
[facility] [address]  [place], [place], [address] Phone: [phone]   Fax:  [phone]  Physical Therapy Treatment  Patient Details  Name: Jennifer Brown MRN: [id number] Date of Birth: [birth year] Referring Provider (PT): Renaldo Reel, [place]   Encounter Date: 05/31/2018  PT End of Session - 05/31/18 1029    Visit Number  15    Number of Visits  15    Date for PT Re-Evaluation  06/15/18    Authorization Type  Medicaid    Authorization Time Period  4 visits from 05/24/18 - 06/20/18    Authorization - Visit Number  2    Authorization - Number of Visits  4    PT Start Time  1018    PT Stop Time  1057    PT Time Calculation (min)  39 min    Activity Tolerance  Patient tolerated treatment well    Behavior During Therapy  [facility] for tasks assessed/performed       Past Medical History:  Diagnosis Date  . Anemia   . Depression    no meds x 1 yr  . Hx of gastric bypass 2004  . Insomnia   . Morbid obesity ([place])   . Ovarian cyst   . Substance abuse (H[facility])    Tobacco, ETOH  . Urinary tract infection     Past Surgical History:  Procedure Laterality Date  . BREAST REDUCTION SURGERY    . BREAST SURGERY  12/25/08   Reduction  . CESAREAN SECTION  2001, 2005, 2009  . CHOLECYSTECTOMY  08/09/02  . GASTRIC BYPASS  01/23/03  . HERNIA REPAIR  11/13/04  . INDUCED ABORTION      There were no vitals filed for this visit.  Subjective Assessment - 05/31/18 1027    Subjective  Pt. noting she got a car and a job and is feeling better "about things".      How long can you sit comfortably?  2 hours     How long can you stand comfortably?  2 hours     How long can you walk comfortably?  40 min     Diagnostic tests  02/18/18 R hip x ray: Healed right greater trochanteric fracture with surrounding heterotopic ossification. Anatomic alignment and position    Patient Stated Goals  be able to manage pain and tools for comfort  and relief    Currently in Pain?  Yes    Pain Score  6     Pain Location  Hip    Pain Orientation  Right    Pain Descriptors / Indicators  Aching;Sore    Pain Type  Acute pain    Multiple Pain Sites  No                       [facility] Adult PT Treatment/Exercise - 05/31/18 1033      Knee/Hip Exercises: Stretches   Hip Flexor Stretch  Right;1 rep;60 seconds    Hip Flexor Stretch Limitations  strap     Piriformis Stretch  Right;Left;2 reps;30 seconds    Piriformis Stretch Limitations  KTOS    with assistance from strap      Knee/Hip Exercises: Aerobic   Stationary Bike  L2 x 7 min       Knee/Hip Exercises: Standing   Forward Lunges  Right;Left;10 reps    Forward Lunges Limitations  holding onto counter     Functional Squat  1 set;15 reps;Limitations    Functional Squat Limitations  TRX    Wall Squat  5 seconds   x 12 reps    Wall Squat Limitations  with adduction ball squeeze     Other Standing Knee Exercises  Side stepping with red looped TB at ankles 2 x 30 ft       Knee/Hip Exercises: Supine   Bridges with Diona Foley Squeeze  Both;Strengthening;1 set;15 reps    Bridges with Clamshell  Strengthening;Both;10 reps;Limitations;1 set   with green TB at knees with isoemtric hip abd/ER               PT Short Term Goals - 05/18/18 1207      PT SHORT TERM GOAL #1   Title  Patient to be independent with initial HEP.    Time  2    Period  Weeks    Status  Achieved        PT Long Term Goals - 05/18/18 1207      PT LONG TERM GOAL #1   Title  Patient to be independent with advanced HEP.    Baseline  reports inconsistent compliance with HEP    Time  6    Period  Weeks    Status  Partially Met      PT LONG TERM GOAL #2   Title  Patient to demonstrate R hip AROM without pain limiting.     Baseline  recent set back is limiting her hip flexion to 70 deg due to pain.     Time  6    Period  Weeks    Status  Partially Met      PT LONG TERM GOAL #3   Title   Patient to demonstrate B LE strength >=4+/5.    Baseline  now 4+/5 bilat grossly    Time  6    Period  Weeks    Status  Achieved      PT LONG TERM GOAL #4   Title  Patient to tolerate walking for 1 hour without pain limiting.     Baseline  45 min     Time  6    Period  Weeks    Status  Partially Met      PT LONG TERM GOAL #5   Title  Patient to report tolerance of sitting in car for 2 hours without pain limiting.    Baseline  improved, now can tolerate one hour.     Time  6    Period  Weeks    Status  Partially Met            Plan - 05/31/18 1056    Clinical Impression Statement  Jennifer Brown tolerated all LE flexibility and standing strengthening therex well today.  Noted decreased R hip pain following session and notes she is now able to tolerate ~ two hours sitting in car without pain making progress toward this LTG.  Now reports improved standing/sitting/walking tolerance which has allowed her to perform new job duties with decreased R hip pain.  On track to meet remaining goals.      PT Treatment/Interventions  ADLs/Self Care Home Management;Cryotherapy;Electrical Stimulation;Iontophoresis 8m/ml Dexamethasone;Moist Heat;Ultrasound;Gait training;Stair training;Functional mobility training;Therapeutic activities;Therapeutic exercise;Manual techniques;Patient/family education;Neuromuscular re-education;Balance training;Passive range of motion;Dry needling;Energy conservation;Splinting;Taping;Vasopneumatic Device    Consulted and Agree with Plan of Care  Patient       Patient will benefit from skilled therapeutic intervention in order to improve the following deficits and impairments:  Hypomobility,  Decreased activity tolerance, Decreased strength, Pain, Decreased mobility, Difficulty walking, Decreased range of motion, Postural dysfunction  Visit Diagnosis: Pain in right hip  Stiffness of right hip, not elsewhere classified  Muscle weakness (generalized)  Difficulty in walking,  not elsewhere classified     Problem List Patient Active Problem List   Diagnosis Date Noted  . Abnormal uterine bleeding 09/30/2014  . Abdominal pain, epigastric 05/01/2014  . Chronic LBP 01/11/2014  . Anxiety 06/15/2013  . Dysmenorrhea 06/15/2013  . Blood pressure elevated 06/15/2013  . Anemia, iron deficiency 06/15/2013  . Headache, migraine 06/15/2013  . H/O gastric bypass 11/27/2012  . Hx of cesarean section 03/11/2012  . Iron deficiency 03/07/2012    Bess Harvest, PTA 05/31/18 12:17 PM   [facility] [address]  [place], [place], [address] Phone: [phone]   Fax:  [phone]  Name: Jennifer Brown MRN: [id number] Date of Birth: 1980-08-17  PHYSICAL THERAPY DISCHARGE SUMMARY  Visits from Start of Care: 15  Current functional level related to goals / functional outcomes: Unable to assess; patient did not return after last session   Remaining deficits: Unable to assess    Education / Equipment: HEP  Plan: Patient agrees to discharge.  Patient goals were partially met. Patient is being discharged due to not returning since the last visit.  ?????     Janene Harvey, PT, DPT 06/27/18 10:28 AM

## 2018-06-07 ENCOUNTER — Ambulatory Visit: Payer: Medicaid Other | Admitting: Physical Therapy

## 2018-06-13 ENCOUNTER — Ambulatory Visit: Payer: Medicaid Other | Admitting: Physical Therapy

## 2018-06-14 ENCOUNTER — Encounter: Payer: Self-pay | Admitting: Physical Therapy

## 2018-06-29 ENCOUNTER — Ambulatory Visit: Payer: Self-pay | Admitting: Family

## 2018-06-29 ENCOUNTER — Other Ambulatory Visit: Payer: Self-pay

## 2018-07-05 ENCOUNTER — Ambulatory Visit: Payer: Self-pay | Admitting: Family

## 2018-07-05 ENCOUNTER — Other Ambulatory Visit: Payer: Self-pay

## 2018-11-21 ENCOUNTER — Telehealth: Payer: Self-pay | Admitting: Family

## 2018-11-21 NOTE — Telephone Encounter (Signed)
Received call form pt to sch lab/ov due to t feeling like her iron is low. sched appt 4//16 at 0930 per request.

## 2018-11-24 ENCOUNTER — Other Ambulatory Visit: Payer: Self-pay

## 2018-11-24 ENCOUNTER — Inpatient Hospital Stay: Payer: Medicaid Other

## 2018-11-24 ENCOUNTER — Encounter: Payer: Self-pay | Admitting: Family

## 2018-11-24 ENCOUNTER — Inpatient Hospital Stay: Payer: Medicaid Other | Attending: Family | Admitting: Family

## 2018-11-24 VITALS — BP 138/99 | HR 84 | Resp 17

## 2018-11-24 DIAGNOSIS — D500 Iron deficiency anemia secondary to blood loss (chronic): Secondary | ICD-10-CM

## 2018-11-24 DIAGNOSIS — Z9884 Bariatric surgery status: Secondary | ICD-10-CM | POA: Diagnosis not present

## 2018-11-24 DIAGNOSIS — N920 Excessive and frequent menstruation with regular cycle: Secondary | ICD-10-CM | POA: Diagnosis not present

## 2018-11-24 DIAGNOSIS — E611 Iron deficiency: Secondary | ICD-10-CM

## 2018-11-24 DIAGNOSIS — K909 Intestinal malabsorption, unspecified: Secondary | ICD-10-CM

## 2018-11-24 DIAGNOSIS — Z720 Tobacco use: Secondary | ICD-10-CM | POA: Diagnosis not present

## 2018-11-24 DIAGNOSIS — D509 Iron deficiency anemia, unspecified: Secondary | ICD-10-CM

## 2018-11-24 LAB — CBC WITH DIFFERENTIAL (CANCER CENTER ONLY)
Abs Immature Granulocytes: 0.01 10*3/uL (ref 0.00–0.07)
Basophils Absolute: 0 10*3/uL (ref 0.0–0.1)
Basophils Relative: 1 %
Eosinophils Absolute: 0.1 10*3/uL (ref 0.0–0.5)
Eosinophils Relative: 2 %
HCT: 29.4 % — ABNORMAL LOW (ref 36.0–46.0)
Hemoglobin: 8.3 g/dL — ABNORMAL LOW (ref 12.0–15.0)
Immature Granulocytes: 0 %
Lymphocytes Relative: 48 %
Lymphs Abs: 2.3 10*3/uL (ref 0.7–4.0)
MCH: 18.4 pg — ABNORMAL LOW (ref 26.0–34.0)
MCHC: 28.2 g/dL — ABNORMAL LOW (ref 30.0–36.0)
MCV: 65.2 fL — ABNORMAL LOW (ref 80.0–100.0)
Monocytes Absolute: 0.5 10*3/uL (ref 0.1–1.0)
Monocytes Relative: 11 %
Neutro Abs: 1.8 10*3/uL (ref 1.7–7.7)
Neutrophils Relative %: 38 %
Platelet Count: 486 10*3/uL — ABNORMAL HIGH (ref 150–400)
RBC: 4.51 MIL/uL (ref 3.87–5.11)
RDW: 20.7 % — ABNORMAL HIGH (ref 11.5–15.5)
WBC Count: 4.8 10*3/uL (ref 4.0–10.5)
nRBC: 0 % (ref 0.0–0.2)

## 2018-11-24 LAB — IRON AND TIBC
Iron: 8 ug/dL — ABNORMAL LOW (ref 41–142)
Saturation Ratios: 2 % — ABNORMAL LOW (ref 21–57)
TIBC: 488 ug/dL — ABNORMAL HIGH (ref 236–444)
UIBC: 479 ug/dL — ABNORMAL HIGH (ref 120–384)

## 2018-11-24 LAB — FERRITIN: Ferritin: <4 ng/mL — ABNORMAL LOW (ref 11–307)

## 2018-11-24 NOTE — Progress Notes (Signed)
Hematology and Oncology Follow Up Visit  Jennifer Brown [id number] 1981/08/07 38 y.o. 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 She is symptomatic with fatigue, dizziness, insomnia, ice cravings, chills and intermittent numbness/tingling in her hands and feet.  Her cycle is quite heavy and lasts around a week and a half.  No fever, n/v, cough, rash, SOB, chest pain, abdominal pain or changes in bowel or bladder habits.  She states that she will occasionally have palpitations due to anxiety. She recently went through a complicated divorce.  No swelling or tenderness in her extremities. No lymphadenopathy noted on exam.  She is still smoking around 3 cigarettes a day. She states that she will sometimes drink anywhere from 1 glass to a bottle of wine in an evening when she is stressed.  She has a good appetite and is staying well hydrated. Her weight is stable.   ECOG Performance Status: 1 - Symptomatic but completely ambulatory  Medications:  Allergies as of 11/24/2018   No Known Allergies     Medication List       Accurate as of November 24, 2018 10:03 AM. Always use your most recent med list.        Advil 200 MG tablet Generic drug:  ibuprofen Take 200 mg by mouth as needed. ADVIL PM FOR SLEEP   busPIRone 15 MG tablet Commonly known as:  BUSPAR TAKE 5MG  (1/3) TABLET 3 TIMES A DAY AS NEEDED FOR ANXIETY   cephALEXin 500 MG capsule Commonly known as:  KEFLEX Take 1 capsule (500 mg total) by mouth 4 (four) times daily.   diphenhydramine-acetaminophen 25-500 MG Tabs tablet Commonly known as:  TYLENOL PM Take by mouth.   folic acid 1 MG tablet Commonly known as:  FOLVITE Take 1 tablet (1 mg total) by mouth daily.   HYDROcodone-acetaminophen 5-325 MG tablet Commonly known as:   NORCO/VICODIN Take 1 tablet by mouth every 6 (six) hours as needed.   metroNIDAZOLE 500 MG tablet Commonly known as:  Flagyl Take 1 tablet (500 mg total) by mouth 2 (two) times daily. One po bid x 7 days   naproxen 500 MG tablet Commonly known as:  NAPROSYN Take 1 tablet (500 mg total) by mouth 2 (two) times daily.   phenazopyridine 200 MG tablet Commonly known as:  PYRIDIUM Take 1 tablet (200 mg total) by mouth 3 (three) times daily.   sertraline 25 MG tablet Commonly known as:  ZOLOFT TAKE 1 TABLET (25 MG TOTAL) BY MOUTH DAILY.   traZODone 50 MG tablet Commonly known as:  DESYREL 100 mg at bedtime as needed.       Allergies: No Known Allergies  Past Medical History, Surgical history, Social history, and Family History were reviewed and updated.  Review of Systems: All other 10 point review of systems is negative.   Physical Exam:  vitals were not taken for this visit.   Wt Readings from Last 3 Encounters:  04/27/18 188 lb 9.6 oz (85.5 kg)  03/16/18 175 lb 8 oz (79.6 kg)  04/07/17 190 lb (86.2 kg)    Ocular: Sclerae unicteric, pupils equal, round and reactive to light Ear-nose-throat: Oropharynx clear, dentition fair Lymphatic: No cervical or supraclavicular adenopathy Lungs no rales or rhonchi, good excursion bilaterally Heart regular rate and rhythm, no murmur appreciated Abd soft, nontender,  positive bowel sounds, no liver or spleen tip palpated on exam, no fluid wave  MSK no focal spinal tenderness, no joint edema Neuro: non-focal, well-oriented, appropriate affect Breasts: Deferred   Lab Results  Component Value Date   WBC 6.0 04/27/2018   HGB 7.7 (L) 04/27/2018   HCT 27.5 (L) 04/27/2018   MCV 70.9 (L) 04/27/2018   PLT 227 04/27/2018   Lab Results  Component Value Date   FERRITIN <4 (L) 04/27/2018   IRON 10 (L) 04/27/2018   TIBC 441 04/27/2018   UIBC 431 04/27/2018   IRONPCTSAT 2 (L) 04/27/2018   Lab Results  Component Value Date    RETICCTPCT 1.1 03/16/2018   RBC 3.88 04/27/2018   RETICCTABS 103.0 04/12/2015   No results found for: KPAFRELGTCHN, LAMBDASER, KAPLAMBRATIO No results found for: IGGSERUM, IGA, IGMSERUM No results found for: Dorene ArOTALPROTELP, ALBUMINELP, A1GS, A2GS, Karn PicklerBETS, BETA2SER, GAMS, MSPIKE, SPEI   Chemistry      Component Value Date/Time   NA 140 05/07/2015 1630   K 4.0 05/07/2015 1630   CL 108 05/07/2015 1630   CO2 26 05/07/2015 1630   BUN 18 05/07/2015 1630   CREATININE 0.64 05/07/2015 1630      Component Value Date/Time   CALCIUM 8.9 05/07/2015 1630   ALKPHOS 55 06/08/2014 1212   AST 24 06/08/2014 1212   ALT 21 06/08/2014 1212   BILITOT 0.3 06/08/2014 1212       Impression and Plan: Ms. Jennifer Brown is a 38 yo African American female with iron deficiency anemia secondary to malabsorption (gastric bypass 2004) and heavy cycles.  She is symptomatic as mentioned above.  We will see what her iron studies show and bring her back in for infusion if needed.  We will plan to see her back in another 8 weeks or so for follow-up.  She will contact our office with any questions or concerns. We can certainly see her sooner if need be.   Emeline GinsSarah , NP 4/16/202010:03 AM

## 2018-11-25 ENCOUNTER — Other Ambulatory Visit: Payer: Self-pay

## 2018-11-25 ENCOUNTER — Inpatient Hospital Stay: Payer: Medicaid Other

## 2018-11-25 VITALS — BP 131/69 | HR 87 | Temp 98.7°F | Resp 18

## 2018-11-25 DIAGNOSIS — D500 Iron deficiency anemia secondary to blood loss (chronic): Secondary | ICD-10-CM

## 2018-11-25 DIAGNOSIS — D509 Iron deficiency anemia, unspecified: Secondary | ICD-10-CM | POA: Diagnosis not present

## 2018-11-25 DIAGNOSIS — N946 Dysmenorrhea, unspecified: Secondary | ICD-10-CM

## 2018-11-25 MED ORDER — SODIUM CHLORIDE 0.9 % IV SOLN
510.0000 mg | Freq: Once | INTRAVENOUS | Status: AC
  Administered 2018-11-25: 14:00:00 510 mg via INTRAVENOUS
  Filled 2018-11-25: qty 17

## 2018-11-25 MED ORDER — SODIUM CHLORIDE 0.9 % IV SOLN
Freq: Once | INTRAVENOUS | Status: AC
  Administered 2018-11-25: 14:00:00 via INTRAVENOUS
  Filled 2018-11-25: qty 250

## 2018-11-25 NOTE — Patient Instructions (Signed)

## 2018-11-25 NOTE — Progress Notes (Signed)
Patient did not want to stay for the 30 minute post Iron infusion observation.VSS.  Patient discharged ambulatory without complaints or concerns.

## 2019-01-08 ENCOUNTER — Other Ambulatory Visit: Payer: Self-pay

## 2019-01-08 ENCOUNTER — Emergency Department (HOSPITAL_BASED_OUTPATIENT_CLINIC_OR_DEPARTMENT_OTHER)
Admission: EM | Admit: 2019-01-08 | Discharge: 2019-01-08 | Disposition: A | Discharge status: Home / Self Care | Payer: Medicaid Other | Attending: Emergency Medicine | Admitting: Emergency Medicine

## 2019-01-08 ENCOUNTER — Encounter (HOSPITAL_BASED_OUTPATIENT_CLINIC_OR_DEPARTMENT_OTHER): Payer: Self-pay | Admitting: Emergency Medicine

## 2019-01-08 DIAGNOSIS — R251 Tremor, unspecified: Secondary | ICD-10-CM | POA: Diagnosis present

## 2019-01-08 DIAGNOSIS — F17210 Nicotine dependence, cigarettes, uncomplicated: Secondary | ICD-10-CM | POA: Diagnosis not present

## 2019-01-08 DIAGNOSIS — Z79899 Other long term (current) drug therapy: Secondary | ICD-10-CM | POA: Insufficient documentation

## 2019-01-08 MED ORDER — LORAZEPAM 1 MG PO TABS
1.0000 mg | ORAL_TABLET | Freq: Once | ORAL | Status: AC
  Administered 2019-01-08: 1 mg via ORAL
  Filled 2019-01-08: qty 1

## 2019-01-08 NOTE — ED Triage Notes (Signed)
Pt states she thinks she had too much to drink yesterday. She feels jittery now. Denies N/V

## 2019-01-08 NOTE — ED Provider Notes (Signed)
[facility] EMERGENCY DEPARTMENT Provider Note   CSN: [id number] Arrival date & time: 01/08/19  1835    History   Chief Complaint Chief Complaint  Patient presents with  . Tremors    HPI Jennifer Brown is a 38 y.o. female.     Patient here for anxiety, concern for alcohol withdrawal.  She states that she has had some mild tremors in her hand.  She drank heavily yesterday.  Drinks more days than not.  Denies any hallucinations.  Denies any chest pain, shortness of breath.  No weakness, no numbness.  The history is provided by the patient.  Illness  Severity:  Mild Onset quality:  Gradual Timing:  Intermittent Progression:  Waxing and waning Chronicity:  New Associated symptoms: no abdominal pain, no chest pain, no cough, no ear pain, no fever, no rash, no shortness of breath, no sore throat and no vomiting     Past Medical History:  Diagnosis Date  . Anemia   . Depression    no meds x 1 yr  . Hx of gastric bypass 2004  . Insomnia   . Morbid obesity (HCC)   . Ovarian cyst   . Substance abuse (HCC)    Tobacco, ETOH  . Urinary tract infection     Patient Active Problem List   Diagnosis Date Noted  . Abnormal uterine bleeding 09/30/2014  . Abdominal pain, epigastric 05/01/2014  . Chronic LBP 01/11/2014  . Anxiety 06/15/2013  . Dysmenorrhea 06/15/2013  . Blood pressure elevated 06/15/2013  . Anemia, iron deficiency 06/15/2013  . Headache, migraine 06/15/2013  . H/O gastric bypass 11/27/2012  . Hx of cesarean section 03/11/2012  . Iron deficiency 03/07/2012    Past Surgical History:  Procedure Laterality Date  . BREAST REDUCTION SURGERY    . BREAST SURGERY  12/25/08   Reduction  . CESAREAN SECTION  2001, 2005, 2009  . CHOLECYSTECTOMY  08/09/02  . GASTRIC BYPASS  01/23/03  . HERNIA REPAIR  11/13/04  . INDUCED ABORTION       OB History    Gravida  8   Para  4   Term  4   Preterm  0   AB  4   Living  4     SAB  1   TAB  3   Ectopic   0   Multiple  0   Live Births  3            Home Medications    Prior to Admission medications   Medication Sig Start Date End Date Taking? Authorizing Provider  busPIRone (BUSPAR) 15 MG tablet TAKE 5MG  (1/3) TABLET 3 TIMES A DAY AS NEEDED FOR ANXIETY 10/19/14   [provider]  cephALEXin (KEFLEX) 500 MG capsule Take 1 capsule (500 mg total) by mouth 4 (four) times daily. 11/24/15   Gwyneth Sprout, MD  citalopram (CELEXA) 20 MG tablet Take by mouth. 11/17/18   [provider]  diphenhydramine-acetaminophen (TYLENOL PM) 25-500 MG TABS Take by mouth.    [provider]  folic acid (FOLVITE) 1 MG tablet Take 1 tablet (1 mg total) by mouth daily. 04/12/15   [place], Brand Males, NP  HYDROcodone-acetaminophen (NORCO/VICODIN) 5-325 MG tablet Take 1 tablet by mouth every 6 (six) hours as needed. 03/24/17   Derwood Kaplan, MD  ibuprofen (ADVIL) 200 MG tablet Take 200 mg by mouth as needed. ADVIL PM FOR SLEEP    [provider]  metroNIDAZOLE (FLAGYL) 500 MG tablet Take  1 tablet (500 mg total) by mouth 2 (two) times daily. One po bid x 7 days 11/27/15   Pricilla LovelessGoldston, Scott, MD  naproxen (NAPROSYN) 500 MG tablet Take 1 tablet (500 mg total) by mouth 2 (two) times daily. 05/07/15   Fayrene Helperran, Bowie, PA-C  phenazopyridine (PYRIDIUM) 200 MG tablet Take 1 tablet (200 mg total) by mouth 3 (three) times daily. 11/24/15   Gwyneth SproutPlunkett, Whitney, MD  sertraline (ZOLOFT) 25 MG tablet TAKE 1 TABLET (25 MG TOTAL) BY MOUTH DAILY. 12/20/14   [provider]  traZODone (DESYREL) 50 MG tablet 100 mg at bedtime as needed. 04/08/18   [provider]  triamcinolone cream (KENALOG) 0.1 % APP EXT AA BID 06/06/18   [provider]    Family History Family History  Problem Relation Age of Onset  . Other Neg Hx     Social History Social History   Tobacco Use  . Smoking status: Current Every Day Smoker    Packs/day: 0.25    Years: 10.00    Pack years: 2.50     Types: Cigarettes    Start date: 09/14/2002  . Smokeless tobacco: Never Used  . Tobacco comment: very light smoker  Substance Use Topics  . Alcohol use: Yes    Alcohol/week: 0.0 standard drinks  . Drug use: No     Allergies   Patient has no known allergies.   Review of Systems Review of Systems  Constitutional: Negative for chills and fever.  HENT: Negative for ear pain and sore throat.   Eyes: Negative for pain and visual disturbance.  Respiratory: Negative for cough and shortness of breath.   Cardiovascular: Negative for chest pain and palpitations.  Gastrointestinal: Negative for abdominal pain and vomiting.  Genitourinary: Negative for dysuria and hematuria.  Musculoskeletal: Negative for arthralgias and back pain.  Skin: Negative for color change and rash.  Neurological: Negative for seizures and syncope.  All other systems reviewed and are negative.    Physical Exam Updated Vital Signs  ED Triage Vitals  Enc Vitals Group     BP 01/08/19 1846 (!) 142/110     Pulse Rate 01/08/19 1846 75     Resp 01/08/19 1846 20     Temp 01/08/19 1846 99 F (37.2 C)     Temp Source 01/08/19 1846 Oral     SpO2 --      Weight 01/08/19 1844 183 lb (83 kg)     Height 01/08/19 1844 5\' 1"  (1.549 m)     Head Circumference --      Peak Flow --      Pain Score 01/08/19 1844 0     Pain Loc --      Pain Edu? --      Excl. in GC? --     Physical Exam Vitals signs and nursing note reviewed.  Constitutional:      General: She is not in acute distress.    Appearance: She is well-developed. She is not ill-appearing.  HENT:     Head: Normocephalic and atraumatic.     Nose: Nose normal.     Mouth/Throat:     Mouth: Mucous membranes are moist.  Eyes:     Extraocular Movements: Extraocular movements intact.     Conjunctiva/sclera: Conjunctivae normal.     Pupils: Pupils are equal, round, and reactive to light.  Neck:     Musculoskeletal: Normal range of motion and neck supple.   Cardiovascular:     Rate and Rhythm: Normal rate  and regular rhythm.     Pulses: Normal pulses.     Heart sounds: Normal heart sounds. No murmur.  Pulmonary:     Effort: Pulmonary effort is normal. No respiratory distress.     Breath sounds: Normal breath sounds.  Abdominal:     General: There is no distension.     Palpations: Abdomen is soft.     Tenderness: There is no abdominal tenderness.  Skin:    General: Skin is warm and dry.     Capillary Refill: Capillary refill takes less than 2 seconds.  Neurological:     General: No focal deficit present.     Mental Status: She is alert and oriented to person, place, and time.     Cranial Nerves: No cranial nerve deficit.     Sensory: No sensory deficit.     Motor: No weakness.     Coordination: Coordination normal.     Gait: Gait normal.  Psychiatric:        Mood and Affect: Mood normal.      ED Treatments / Results  Labs (all labs ordered are listed, but only abnormal results are displayed) Labs Reviewed - No data to display  EKG None  Radiology No results found.  Procedures Procedures (including critical care time)  Medications Ordered in ED Medications  LORazepam (ATIVAN) tablet 1 mg (has no administration in time range)     Initial Impression / Assessment and Plan / ED Course  I have reviewed the triage vital signs and the nursing notes.  Pertinent labs & imaging results that were available during my care of the patient were reviewed by me and considered in my medical decision making (see chart for details).        Jennifer Brown is a 38 year old female who presents to the ED with tremors.  Patient with normal vitals.  No fever.  Patient states that she is felt slightly more anxious and jittery since drinking too much alcohol yesterday.  This can happen at times for her.  She drinks several alcoholic drinks most days than not.  She is not ready to stop drinking.  Has not had any alcohol today.  Will treat with  Ativan.  She does not have any signs to suggest severe alcohol withdrawal.  Last alcoholic beverage was last night.  Given education about alcohol use.  Given resources for alcohol abuse help when she is ready for it.  Discharged from ED in good condition.  Patient did not have any obvious tremors or physical findings on exam. No concern for stroke or cardiac process.  Overall she is well-appearing.  Suspect anxiety component as well.  This chart was dictated using voice recognition software.  Despite best efforts to proofread,  errors can occur which can change the documentation meaning.    Final Clinical Impressions(s) / ED Diagnoses   Final diagnoses:  Tremor    ED Discharge Orders    None       Virgina Norfolk, DO 01/08/19 1900

## 2019-01-27 ENCOUNTER — Inpatient Hospital Stay: Payer: Medicaid Other | Admitting: Hematology & Oncology

## 2019-01-27 ENCOUNTER — Inpatient Hospital Stay: Payer: Medicaid Other

## 2019-02-09 ENCOUNTER — Inpatient Hospital Stay: Payer: Medicaid Other | Attending: Family

## 2019-02-09 ENCOUNTER — Inpatient Hospital Stay: Payer: Medicaid Other | Admitting: Hematology & Oncology
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
## 2019-07-04 ENCOUNTER — Encounter (HOSPITAL_BASED_OUTPATIENT_CLINIC_OR_DEPARTMENT_OTHER): Payer: Self-pay

## 2019-07-04 ENCOUNTER — Other Ambulatory Visit: Payer: Self-pay

## 2019-07-04 ENCOUNTER — Emergency Department (HOSPITAL_BASED_OUTPATIENT_CLINIC_OR_DEPARTMENT_OTHER)
Admission: EM | Admit: 2019-07-04 | Discharge: 2019-07-04 | Disposition: A | Discharge status: Home / Self Care | Payer: Medicaid Other | Attending: Emergency Medicine | Admitting: Emergency Medicine

## 2019-07-04 DIAGNOSIS — F17210 Nicotine dependence, cigarettes, uncomplicated: Secondary | ICD-10-CM | POA: Insufficient documentation

## 2019-07-04 DIAGNOSIS — Z79899 Other long term (current) drug therapy: Secondary | ICD-10-CM | POA: Diagnosis not present

## 2019-07-04 DIAGNOSIS — R112 Nausea with vomiting, unspecified: Secondary | ICD-10-CM | POA: Diagnosis not present

## 2019-07-04 DIAGNOSIS — R519 Headache, unspecified: Secondary | ICD-10-CM

## 2019-07-04 DIAGNOSIS — G43909 Migraine, unspecified, not intractable, without status migrainosus: Secondary | ICD-10-CM | POA: Insufficient documentation

## 2019-07-04 DIAGNOSIS — R5383 Other fatigue: Secondary | ICD-10-CM | POA: Diagnosis present

## 2019-07-04 DIAGNOSIS — Z20828 Contact with and (suspected) exposure to other viral communicable diseases: Secondary | ICD-10-CM | POA: Diagnosis not present

## 2019-07-04 DIAGNOSIS — R109 Unspecified abdominal pain: Secondary | ICD-10-CM | POA: Diagnosis not present

## 2019-07-04 LAB — COMPREHENSIVE METABOLIC PANEL
ALT: 49 U/L — ABNORMAL HIGH (ref 0–44)
AST: 88 U/L — ABNORMAL HIGH (ref 15–41)
Albumin: 4.1 g/dL (ref 3.5–5.0)
Alkaline Phosphatase: 71 U/L (ref 38–126)
Anion gap: 15 (ref 5–15)
BUN: 10 mg/dL (ref 6–20)
CO2: 21 mmol/L — ABNORMAL LOW (ref 22–32)
Calcium: 8.7 mg/dL — ABNORMAL LOW (ref 8.9–10.3)
Chloride: 100 mmol/L (ref 98–111)
Creatinine, Ser: 0.56 mg/dL (ref 0.44–1.00)
GFR calc Af Amer: >60 mL/min (ref >60)
GFR calc non Af Amer: >60 mL/min (ref >60)
Glucose, Bld: 118 mg/dL — ABNORMAL HIGH (ref 70–99)
Potassium: 3.8 mmol/L (ref 3.5–5.1)
Sodium: 136 mmol/L (ref 135–145)
Total Bilirubin: 0.4 mg/dL (ref 0.3–1.2)
Total Protein: 7.9 g/dL (ref 6.5–8.1)

## 2019-07-04 LAB — CBC WITH DIFFERENTIAL/PLATELET
Abs Immature Granulocytes: 0.02 10*3/uL (ref 0.00–0.07)
Basophils Absolute: 0.1 10*3/uL (ref 0.0–0.1)
Basophils Relative: 1 %
Eosinophils Absolute: 0 10*3/uL (ref 0.0–0.5)
Eosinophils Relative: 0 %
HCT: 43.6 % (ref 36.0–46.0)
Hemoglobin: 14 g/dL (ref 12.0–15.0)
Immature Granulocytes: 0 %
Lymphocytes Relative: 39 %
Lymphs Abs: 2.9 10*3/uL (ref 0.7–4.0)
MCH: 26 pg (ref 26.0–34.0)
MCHC: 32.1 g/dL (ref 30.0–36.0)
MCV: 81 fL (ref 80.0–100.0)
Monocytes Absolute: 0.6 10*3/uL (ref 0.1–1.0)
Monocytes Relative: 8 %
Neutro Abs: 3.9 10*3/uL (ref 1.7–7.7)
Neutrophils Relative %: 52 %
Platelets: 319 10*3/uL (ref 150–400)
RBC: 5.38 MIL/uL — ABNORMAL HIGH (ref 3.87–5.11)
RDW: 21.8 % — ABNORMAL HIGH (ref 11.5–15.5)
Smear Review: NORMAL
WBC Morphology: ABNORMAL
WBC: 7.5 10*3/uL (ref 4.0–10.5)
nRBC: 0 % (ref 0.0–0.2)

## 2019-07-04 LAB — LIPASE, BLOOD: Lipase: 22 U/L (ref 11–51)

## 2019-07-04 LAB — SARS CORONAVIRUS 2 AG (30 MIN TAT): SARS Coronavirus 2 Ag: NEGATIVE

## 2019-07-04 MED ORDER — DIPHENHYDRAMINE HCL 50 MG/ML IJ SOLN
25.0000 mg | Freq: Once | INTRAMUSCULAR | Status: AC
  Administered 2019-07-04: 25 mg via INTRAVENOUS
  Filled 2019-07-04: qty 1

## 2019-07-04 MED ORDER — SODIUM CHLORIDE 0.9 % IV BOLUS
1000.0000 mL | Freq: Once | INTRAVENOUS | Status: AC
  Administered 2019-07-04: 1000 mL via INTRAVENOUS

## 2019-07-04 MED ORDER — DEXAMETHASONE SODIUM PHOSPHATE 10 MG/ML IJ SOLN
10.0000 mg | Freq: Once | INTRAMUSCULAR | Status: AC
  Administered 2019-07-04: 10 mg via INTRAVENOUS
  Filled 2019-07-04: qty 1

## 2019-07-04 MED ORDER — PROCHLORPERAZINE EDISYLATE 10 MG/2ML IJ SOLN
10.0000 mg | Freq: Once | INTRAMUSCULAR | Status: AC
  Administered 2019-07-04: 10 mg via INTRAVENOUS
  Filled 2019-07-04: qty 2

## 2019-07-04 MED ORDER — PROCHLORPERAZINE MALEATE 10 MG PO TABS: 10.0000 mg | ORAL_TABLET | Freq: Two times a day (BID) | ORAL | 0 refills | Status: DC | PRN

## 2019-07-04 NOTE — Discharge Instructions (Addendum)
Your work-up today was overall reassuring and your headache improved after the fluids and medications. We feel you are safe for discharge home to follow up on your Covid test and rest. Please stay hydrated.

## 2019-07-04 NOTE — ED Provider Notes (Signed)
[facility] EMERGENCY DEPARTMENT Provider Note   CSN: [id number] Arrival date & time: 07/04/19  1625     History   Chief Complaint Chief Complaint  Patient presents with   Fatigue   Abdominal Pain    HPI Jennifer Brown is a 38 y.o. female.     The history is provided by the patient and medical records. No language interpreter was used.  Emesis Severity:  Moderate Duration:  4 days Timing:  Intermittent Quality:  Stomach contents Progression:  Unchanged Chronicity:  New Recent urination:  Normal Relieved by:  Nothing Worsened by:  Nothing Ineffective treatments:  None tried Associated symptoms: abdominal pain (mild) and headaches   Associated symptoms: no chills, no cough, no diarrhea, no fever and no URI     Past Medical History:  Diagnosis Date   Anemia    Depression    no meds x 1 yr   Hx of gastric bypass 2004   Insomnia    Morbid obesity (HCC)    Ovarian cyst    Substance abuse (HCC)    Tobacco, ETOH   Urinary tract infection     Patient Active Problem List   Diagnosis Date Noted   Abnormal uterine bleeding 09/30/2014   Abdominal pain, epigastric 05/01/2014   Chronic LBP 01/11/2014   Anxiety 06/15/2013   Dysmenorrhea 06/15/2013   Blood pressure elevated 06/15/2013   Anemia, iron deficiency 06/15/2013   Headache, migraine 06/15/2013   H/O gastric bypass 11/27/2012   Hx of cesarean section 03/11/2012   Iron deficiency 03/07/2012    Past Surgical History:  Procedure Laterality Date   BREAST REDUCTION SURGERY     BREAST SURGERY  12/25/08   Reduction   CESAREAN SECTION  2001, 2005, 2009   CHOLECYSTECTOMY  08/09/02   GASTRIC BYPASS  01/23/03   HERNIA REPAIR  11/13/04   INDUCED ABORTION       OB History    Gravida  8   Para  4   Term  4   Preterm  0   AB  4   Living  4     SAB  1   TAB  3   Ectopic  0   Multiple  0   Live Births  3            Home Medications    Prior to  Admission medications   Medication Sig Start Date End Date Taking? Authorizing Provider  busPIRone (BUSPAR) 15 MG tablet TAKE 5MG  (1/3) TABLET 3 TIMES A DAY AS NEEDED FOR ANXIETY 10/19/14   [provider]  cephALEXin (KEFLEX) 500 MG capsule Take 1 capsule (500 mg total) by mouth 4 (four) times daily. 11/24/15   Gwyneth SproutPlunkett, Whitney, MD  citalopram (CELEXA) 20 MG tablet Take by mouth. 11/17/18   [provider]  diphenhydramine-acetaminophen (TYLENOL PM) 25-500 MG TABS Take by mouth.    [provider]  folic acid (FOLVITE) 1 MG tablet Take 1 tablet (1 mg total) by mouth daily. 04/12/15   [place], Brand MalesSarah M, NP  HYDROcodone-acetaminophen (NORCO/VICODIN) 5-325 MG tablet Take 1 tablet by mouth every 6 (six) hours as needed. 03/24/17   Derwood KaplanNanavati, Ankit, MD  ibuprofen (ADVIL) 200 MG tablet Take 200 mg by mouth as needed. ADVIL PM FOR SLEEP    [provider]  metroNIDAZOLE (FLAGYL) 500 MG tablet Take 1 tablet (500 mg total) by mouth 2 (two) times daily. One po bid x 7 days 11/27/15   Pricilla LovelessGoldston, Scott, MD  naproxen (NAPROSYN) 500 MG tablet Take 1 tablet (500 mg total) by mouth 2 (two) times daily. 05/07/15   Fayrene Helper, PA-C  phenazopyridine (PYRIDIUM) 200 MG tablet Take 1 tablet (200 mg total) by mouth 3 (three) times daily. 11/24/15   Gwyneth Sprout, MD  sertraline (ZOLOFT) 25 MG tablet TAKE 1 TABLET (25 MG TOTAL) BY MOUTH DAILY. 12/20/14   [provider]  traZODone (DESYREL) 50 MG tablet 100 mg at bedtime as needed. 04/08/18   [provider]  triamcinolone cream (KENALOG) 0.1 % APP EXT AA BID 06/06/18   [provider]    Family History Family History  Problem Relation Age of Onset   Other Neg Hx     Social History Social History   Tobacco Use   Smoking status: Current Every Day Smoker    Packs/day: 0.25    Years: 10.00    Pack years: 2.50    Types: Cigarettes    Start date: 09/14/2002   Smokeless tobacco: Never Used  Substance Use  Topics   Alcohol use: Yes    Alcohol/week: 0.0 standard drinks    Comment: occ   Drug use: No     Allergies   Patient has no known allergies.   Review of Systems Review of Systems  Constitutional: Negative for chills, diaphoresis, fatigue and fever.  HENT: Negative for congestion.   Respiratory: Negative for cough, choking, shortness of breath and wheezing.   Cardiovascular: Negative for chest pain.  Gastrointestinal: Positive for abdominal pain (mild), nausea and vomiting. Negative for constipation and diarrhea.  Genitourinary: Negative for flank pain.  Musculoskeletal: Negative for back pain, gait problem, neck pain and neck stiffness.  Skin: Negative for rash and wound.  Neurological: Positive for headaches. Negative for dizziness, seizures, weakness, light-headedness and numbness.  Psychiatric/Behavioral: Negative for agitation.     Physical Exam Updated Vital Signs BP (!) 125/107 (BP Location: Left Arm)    Pulse (!) 115    Temp 99.1 F (37.3 C) (Oral)    Resp 20    Ht  (1.549 m)    Wt 87.1 kg    LMP 06/28/2019    SpO2 98%    BMI 36.28 kg/m   Physical Exam Vitals signs and nursing note reviewed.  Constitutional:      General: She is not in acute distress.    Appearance: She is well-developed. She is not ill-appearing, toxic-appearing or diaphoretic.  HENT:     Head: Normocephalic and atraumatic.     Right Ear: External ear normal.     Left Ear: External ear normal.     Nose: Nose normal.     Mouth/Throat:     Mouth: Mucous membranes are moist.     Pharynx: No pharyngeal swelling or oropharyngeal exudate.  Eyes:     General: No scleral icterus.    Extraocular Movements: Extraocular movements intact.     Conjunctiva/sclera: Conjunctivae normal.     Pupils: Pupils are equal, round, and reactive to light.  Neck:     Musculoskeletal: Normal range of motion and neck supple.  Cardiovascular:     Rate and Rhythm: Normal rate.     Heart sounds: No murmur.    Pulmonary:     Effort: No respiratory distress.     Breath sounds: No stridor. No wheezing, rhonchi or rales.  Chest:     Chest wall: No tenderness.  Abdominal:     General: Abdomen is flat. Bowel sounds are normal. There is no distension.  Tenderness: There is no abdominal tenderness. There is no right CVA tenderness, left CVA tenderness, guarding or rebound.  Skin:    General: Skin is warm.     Findings: No erythema or rash.  Neurological:     General: No focal deficit present.     Mental Status: She is alert and oriented to person, place, and time.     Motor: No abnormal muscle tone.     Coordination: Coordination normal.     Deep Tendon Reflexes: Reflexes are normal and symmetric.  Psychiatric:        Mood and Affect: Mood normal.      ED Treatments / Results  Labs (all labs ordered are listed, but only abnormal results are displayed) Labs Reviewed  CBC WITH DIFFERENTIAL/PLATELET - Abnormal; Notable for the following components:      Result Value   RBC 5.38 (*)    RDW 21.8 (*)    All other components within normal limits  COMPREHENSIVE METABOLIC PANEL - Abnormal; Notable for the following components:   CO2 21 (*)    Glucose, Bld 118 (*)    Calcium 8.7 (*)    AST 88 (*)    ALT 49 (*)    All other components within normal limits  SARS CORONAVIRUS 2 AG (30 MIN TAT)  NOVEL CORONAVIRUS, NAA (HOSP ORDER, SEND-OUT TO REF LAB; TAT 18-24 HRS)  LIPASE, BLOOD    EKG None  Radiology No results found.  Procedures Procedures (including critical care time)  Medications Ordered in ED Medications  sodium chloride 0.9 % bolus 1,000 mL ( Intravenous Stopped 07/04/19 2220)  prochlorperazine (COMPAZINE) injection 10 mg (10 mg Intravenous Given 07/04/19 2116)  diphenhydrAMINE (BENADRYL) injection 25 mg (25 mg Intravenous Given 07/04/19 2115)  dexamethasone (DECADRON) injection 10 mg (10 mg Intravenous Given 07/04/19 2118)     Initial Impression / Assessment and Plan /  ED Course  I have reviewed the triage vital signs and the nursing notes.  Pertinent labs & imaging results that were available during my care of the patient were reviewed by me and considered in my medical decision making (see chart for details).        Jennifer Brown is a 38 y.o. female with a past medical history significant for obesity, prior gastric bypass surgery, and migraine headaches who presents with nausea, vomiting, and headache.  Patient reports that for the last 4 days she has had nausea and vomiting and decreased oral intake.  She reports she has had some abdominal epigastric cramping with the vomiting.  No constant pain.  She reports over the last day and a half she started having some headaches with light sensitivity and some flashes in her vision.  Patient reports has been a long time since similar headache and she does have migraines in her chart.  She denies recent trauma.  She denies constipation or diarrhea.  She reports is on her menstrual cycle.  She reports she feels dehydrated because she has not eaten eat or drink well in the last several days.  She denies any sick contacts but does work at [employer].  On exam, lungs are clear and chest is nontender.  Abdomen is nontender on my exam.  Normal bowel sounds appreciated.  Patient had no focal neurologic deficits and did have photosensitivity.  Pupils are symmetric and reactive with normal extraocular movements.  Normal coordination and sensation.  Normal strength in extremities.  Clinical aspect patient has is dehydrated leading to her migrainous  type headache.  Given lack of focal deficits, neck pain, neck stiffness, do not feel she needs CT imaging at this time.  Will give headache cocktail and fluids as well as check electrolytes to look for imbalance with the constant vomiting.  Low suspicion for meningitis or significant abnormality in her abdomen.  Her abdomen was nontender on my exam.  Do not feel she needs CT at this  time.  Patient is agreeable to this plan and medications.  Anticipate discharge if work-up is reassuring.  Will get rapid Covid initially and will likely do send out if it is negative.  Patient felt much better after medications.  Headache resolved.  Nausea improved.  Patient able to ambulate without difficulty.  Patient will be given the second Covid test and will be discharged home.  Patient agreed with discharge instructions and was discharged in good condition.   Final Clinical Impressions(s) / ED Diagnoses   Final diagnoses:  Non-intractable vomiting with nausea, unspecified vomiting type  Acute nonintractable headache, unspecified headache type    ED Discharge Orders         Ordered    prochlorperazine (COMPAZINE) 10 MG tablet  2 times daily PRN     07/04/19 2301          Clinical Impression: 1. Non-intractable vomiting with nausea, unspecified vomiting type   2. Acute nonintractable headache, unspecified headache type     Disposition: Discharge  Condition: Good  I have discussed the results, Dx and Tx plan with the pt(& family if present). He/she/they expressed understanding and agree(s) with the plan. Discharge instructions discussed at great length. Strict return precautions discussed and pt &/or family have verbalized understanding of the instructions. No further questions at time of discharge.    Discharge Medication List as of 07/04/2019 11:04 PM    START taking these medications   Details  prochlorperazine (COMPAZINE) 10 MG tablet Take 1 tablet (10 mg total) by mouth 2 (two) times daily as needed for nausea or vomiting., Starting Tue 07/04/2019, Print        Follow Up: Loura Pardon, PA [address] [place] [phone]     [facility] [address] [id number] [address] [place] [address] [phone]       Tirth Cothron, Canary Brim, MD 07/05/19 [phone]

## 2019-07-04 NOTE — ED Triage Notes (Signed)
Pt c/o abd pain,fatigue, n/v x 4 days-NAD-steady gait

## 2019-07-06 LAB — NOVEL CORONAVIRUS, NAA (HOSP ORDER, SEND-OUT TO REF LAB; TAT 18-24 HRS): SARS-CoV-2, NAA: NOT DETECTED

## 2020-05-29 ENCOUNTER — Other Ambulatory Visit: Payer: Self-pay | Admitting: Family

## 2020-05-29 DIAGNOSIS — D500 Iron deficiency anemia secondary to blood loss (chronic): Secondary | ICD-10-CM

## 2020-05-29 DIAGNOSIS — N946 Dysmenorrhea, unspecified: Secondary | ICD-10-CM

## 2020-05-30 ENCOUNTER — Inpatient Hospital Stay: Payer: Medicaid Other

## 2020-05-30 ENCOUNTER — Inpatient Hospital Stay: Payer: Medicaid Other | Attending: Family | Admitting: Family

## 2021-06-16 ENCOUNTER — Encounter: Payer: Self-pay | Admitting: Family

## 2021-06-20 ENCOUNTER — Other Ambulatory Visit: Payer: Self-pay | Admitting: Family

## 2021-06-20 DIAGNOSIS — D500 Iron deficiency anemia secondary to blood loss (chronic): Secondary | ICD-10-CM

## 2021-06-23 ENCOUNTER — Inpatient Hospital Stay: Payer: 59 | Attending: Family

## 2021-06-23 ENCOUNTER — Inpatient Hospital Stay: Payer: 59 | Admitting: Family

## 2021-06-23 DIAGNOSIS — K909 Intestinal malabsorption, unspecified: Secondary | ICD-10-CM | POA: Insufficient documentation

## 2021-06-23 DIAGNOSIS — Z9884 Bariatric surgery status: Secondary | ICD-10-CM | POA: Insufficient documentation

## 2021-06-23 DIAGNOSIS — D509 Iron deficiency anemia, unspecified: Secondary | ICD-10-CM | POA: Insufficient documentation

## 2021-07-02 ENCOUNTER — Inpatient Hospital Stay (HOSPITAL_BASED_OUTPATIENT_CLINIC_OR_DEPARTMENT_OTHER): Payer: 59 | Admitting: Family

## 2021-07-02 ENCOUNTER — Encounter: Payer: Self-pay | Admitting: Family

## 2021-07-02 ENCOUNTER — Other Ambulatory Visit: Payer: Self-pay

## 2021-07-02 ENCOUNTER — Inpatient Hospital Stay: Payer: 59

## 2021-07-02 VITALS — BP 146/90 | HR 79 | Temp 98.0°F | Resp 18 | Ht 61.02 in | Wt 204.1 lb

## 2021-07-02 DIAGNOSIS — D509 Iron deficiency anemia, unspecified: Secondary | ICD-10-CM | POA: Diagnosis present

## 2021-07-02 DIAGNOSIS — Z9884 Bariatric surgery status: Secondary | ICD-10-CM | POA: Diagnosis not present

## 2021-07-02 DIAGNOSIS — K909 Intestinal malabsorption, unspecified: Secondary | ICD-10-CM | POA: Diagnosis not present

## 2021-07-02 DIAGNOSIS — D500 Iron deficiency anemia secondary to blood loss (chronic): Secondary | ICD-10-CM | POA: Diagnosis not present

## 2021-07-02 LAB — CBC WITH DIFFERENTIAL (CANCER CENTER ONLY)
Abs Immature Granulocytes: 0.01 10*3/uL (ref 0.00–0.07)
Basophils Absolute: 0.1 10*3/uL (ref 0.0–0.1)
Basophils Relative: 1 %
Eosinophils Absolute: 0.1 10*3/uL (ref 0.0–0.5)
Eosinophils Relative: 1 %
HCT: 38.1 % (ref 36.0–46.0)
Hemoglobin: 12.4 g/dL (ref 12.0–15.0)
Immature Granulocytes: 0 %
Lymphocytes Relative: 37 %
Lymphs Abs: 2.7 10*3/uL (ref 0.7–4.0)
MCH: 27 pg (ref 26.0–34.0)
MCHC: 32.5 g/dL (ref 30.0–36.0)
MCV: 83 fL (ref 80.0–100.0)
Monocytes Absolute: 0.7 10*3/uL (ref 0.1–1.0)
Monocytes Relative: 9 %
Neutro Abs: 3.8 10*3/uL (ref 1.7–7.7)
Neutrophils Relative %: 52 %
Platelet Count: 336 10*3/uL (ref 150–400)
RBC: 4.59 MIL/uL (ref 3.87–5.11)
RDW: 19.1 % — ABNORMAL HIGH (ref 11.5–15.5)
WBC Count: 7.3 10*3/uL (ref 4.0–10.5)
nRBC: 0 % (ref 0.0–0.2)

## 2021-07-02 LAB — RETICULOCYTES
Immature Retic Fract: 8.4 % (ref 2.3–15.9)
RBC.: 4.56 MIL/uL (ref 3.87–5.11)
Retic Count, Absolute: 72 10*3/uL (ref 19.0–186.0)
Retic Ct Pct: 1.6 % (ref 0.4–3.1)

## 2021-07-02 LAB — IRON AND TIBC
Iron: 25 ug/dL — ABNORMAL LOW (ref 28–170)
Saturation Ratios: 5 % — ABNORMAL LOW (ref 10.4–31.8)
TIBC: 494 ug/dL — ABNORMAL HIGH (ref 250–450)
UIBC: 469 ug/dL

## 2021-07-02 LAB — FERRITIN: Ferritin: 8 ng/mL — ABNORMAL LOW (ref 11–307)

## 2021-07-02 NOTE — Progress Notes (Signed)
Hematology and Oncology Follow Up Visit  Jennifer Brown [id number] June 29, 1981 40 y.o. 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 She is doing well and has no complaints at this time.  She states that she had a hysterectomy in December 2020.  No blood loss noted. No bruising or petechiae.  No fever, chills, n/v, rash, dizziness, SOB, chest pain, palpitations, abdominal pain or changes in bowel or bladder habits.  No swelling, tenderness, numbness or tingling in her extremities.  No falls or syncope.  She has maintained a good appetite and is staying well hydrated. She states that she has been craving spinach in everything. Her weight is stable at 192 lbs.   ECOG Performance Status: 1 - Symptomatic but completely ambulatory  Medications:  Allergies as of 07/02/2021   No Known Allergies      Medication List        Accurate as of July 02, 2021  2:20 PM. If you have any questions, ask your nurse or doctor.          Advil 200 MG tablet Generic drug: ibuprofen Take 200 mg by mouth as needed. ADVIL PM FOR SLEEP   busPIRone 15 MG tablet Commonly known as: BUSPAR TAKE 5MG  (1/3) TABLET 3 TIMES A DAY AS NEEDED FOR ANXIETY   cephALEXin 500 MG capsule Commonly known as: KEFLEX Take 1 capsule (500 mg total) by mouth 4 (four) times daily.   citalopram 20 MG tablet Commonly known as: CELEXA Take by mouth.   diphenhydramine-acetaminophen 25-500 MG Tabs tablet Commonly known as: TYLENOL PM Take by mouth.   folic acid 1 MG tablet Commonly known as: FOLVITE Take 1 tablet (1 mg total) by mouth daily.   HYDROcodone-acetaminophen 5-325 MG tablet Commonly known as: NORCO/VICODIN Take 1 tablet by mouth every 6 (six) hours as needed.   metroNIDAZOLE 500 MG tablet Commonly known as:  Flagyl Take 1 tablet (500 mg total) by mouth 2 (two) times daily. One po bid x 7 days   naproxen 500 MG tablet Commonly known as: NAPROSYN Take 1 tablet (500 mg total) by mouth 2 (two) times daily.   phenazopyridine 200 MG tablet Commonly known as: PYRIDIUM Take 1 tablet (200 mg total) by mouth 3 (three) times daily.   prochlorperazine 10 MG tablet Commonly known as: COMPAZINE Take 1 tablet (10 mg total) by mouth 2 (two) times daily as needed for nausea or vomiting.   sertraline 25 MG tablet Commonly known as: ZOLOFT TAKE 1 TABLET (25 MG TOTAL) BY MOUTH DAILY.   traZODone 50 MG tablet Commonly known as: DESYREL 100 mg at bedtime as needed.   triamcinolone cream 0.1 % Commonly known as: KENALOG APP EXT AA BID        Allergies: No Known Allergies  Past Medical History, Surgical history, Social history, and Family History were reviewed and updated.  Review of Systems: All other 10 point review of systems is negative.   Physical Exam:  vitals were not taken for this visit.   Wt Readings from Last 3 Encounters:  07/04/19 192 lb (87.1 kg)  01/08/19 183 lb (83 kg)  04/27/18 188 lb 9.6 oz (85.5 kg)    Ocular: Sclerae unicteric, pupils equal, round and reactive to light Ear-nose-throat: Oropharynx clear, dentition fair Lymphatic:  No cervical or supraclavicular adenopathy Lungs no rales or rhonchi, good excursion bilaterally Heart regular rate and rhythm, no murmur appreciated Abd soft, nontender, positive bowel sounds MSK no focal spinal tenderness, no joint edema Neuro: non-focal, well-oriented, appropriate affect Breasts: Deferred   Lab Results  Component Value Date   WBC 7.3 07/02/2021   HGB 12.4 07/02/2021   HCT 38.1 07/02/2021   MCV 83.0 07/02/2021   PLT 336 07/02/2021   Lab Results  Component Value Date   FERRITIN <4 (L) 11/24/2018   IRON 8 (L) 11/24/2018   TIBC 488 (H) 11/24/2018   UIBC 479 (H) 11/24/2018   IRONPCTSAT 2 (L) 11/24/2018   Lab  Results  Component Value Date   RETICCTPCT 1.6 07/02/2021   RBC 4.59 07/02/2021   RBC 4.56 07/02/2021   RETICCTABS 103.0 04/12/2015   No results found for: KPAFRELGTCHN, LAMBDASER, KAPLAMBRATIO No results found for: IGGSERUM, IGA, IGMSERUM No results found for: Dorene Ar, A1GS, A2GS, Colin Benton, MSPIKE, SPEI   Chemistry      Component Value Date/Time   NA 136 07/04/2019 2101   K 3.8 07/04/2019 2101   CL 100 07/04/2019 2101   CO2 21 (L) 07/04/2019 2101   BUN 10 07/04/2019 2101   CREATININE 0.56 07/04/2019 2101      Component Value Date/Time   CALCIUM 8.7 (L) 07/04/2019 2101   ALKPHOS 71 07/04/2019 2101   AST 88 (H) 07/04/2019 2101   ALT 49 (H) 07/04/2019 2101   BILITOT 0.4 07/04/2019 2101       Impression and Plan: Jennifer Brown is a 40 yo African American female with iron deficiency anemia secondary to malabsorption (gastric bypass 2004) and heavy cycles.  Iron studies are pending. We will replace if needed.  Follow-up in 6 months  She can contact our office with any questions or concerns.   Eileen Stanford, NP 11/23/20222:20 PM

## 2021-07-07 ENCOUNTER — Telehealth: Payer: Self-pay | Admitting: *Deleted

## 2021-07-07 ENCOUNTER — Other Ambulatory Visit: Payer: Self-pay | Admitting: Family

## 2021-07-07 NOTE — Telephone Encounter (Signed)
Per scheduling message Sarah - called and gave upcoming appointments - (2) doses of IV Iron - confirmed 

## 2021-07-10 ENCOUNTER — Encounter: Payer: Self-pay | Admitting: Family

## 2021-07-11 ENCOUNTER — Inpatient Hospital Stay: Payer: 59 | Attending: Family

## 2021-07-11 ENCOUNTER — Other Ambulatory Visit: Payer: Self-pay

## 2021-07-11 VITALS — BP 114/67 | HR 71 | Temp 98.0°F | Resp 17

## 2021-07-11 DIAGNOSIS — K909 Intestinal malabsorption, unspecified: Secondary | ICD-10-CM | POA: Insufficient documentation

## 2021-07-11 DIAGNOSIS — N946 Dysmenorrhea, unspecified: Secondary | ICD-10-CM

## 2021-07-11 DIAGNOSIS — D509 Iron deficiency anemia, unspecified: Secondary | ICD-10-CM | POA: Diagnosis not present

## 2021-07-11 DIAGNOSIS — Z9884 Bariatric surgery status: Secondary | ICD-10-CM | POA: Diagnosis not present

## 2021-07-11 DIAGNOSIS — D500 Iron deficiency anemia secondary to blood loss (chronic): Secondary | ICD-10-CM

## 2021-07-11 MED ORDER — SODIUM CHLORIDE 0.9 % IV SOLN: Freq: Once | INTRAVENOUS | Status: AC

## 2021-07-11 MED ORDER — SODIUM CHLORIDE 0.9 % IV SOLN
125.0000 mg | Freq: Once | INTRAVENOUS | Status: AC
  Administered 2021-07-11: 125 mg via INTRAVENOUS
  Filled 2021-07-11: qty 125

## 2021-07-11 NOTE — Patient Instructions (Signed)

## 2021-07-17 ENCOUNTER — Telehealth: Payer: Self-pay | Admitting: *Deleted

## 2021-07-17 NOTE — Telephone Encounter (Signed)
Per scheduling message Erie Noe - called and gave upcoming appointments

## 2021-07-18 ENCOUNTER — Ambulatory Visit: Payer: 59

## 2021-07-21 ENCOUNTER — Telehealth: Payer: Self-pay | Admitting: Family

## 2021-07-30 ENCOUNTER — Inpatient Hospital Stay: Payer: 59

## 2021-12-23 ENCOUNTER — Encounter: Payer: Self-pay | Admitting: Family

## 2021-12-30 ENCOUNTER — Inpatient Hospital Stay: Payer: Medicaid Other

## 2021-12-30 ENCOUNTER — Inpatient Hospital Stay: Payer: Medicaid Other | Admitting: Family

## 2022-02-04 ENCOUNTER — Inpatient Hospital Stay (HOSPITAL_BASED_OUTPATIENT_CLINIC_OR_DEPARTMENT_OTHER): Payer: Medicaid Other | Admitting: Family

## 2022-02-04 ENCOUNTER — Encounter: Payer: Self-pay | Admitting: Family

## 2022-02-04 ENCOUNTER — Other Ambulatory Visit: Payer: Self-pay

## 2022-02-04 ENCOUNTER — Inpatient Hospital Stay: Payer: Medicaid Other | Attending: Hematology & Oncology

## 2022-02-04 VITALS — BP 157/90 | HR 72 | Temp 98.4°F | Resp 18 | Ht 61.0 in | Wt 217.1 lb

## 2022-02-04 DIAGNOSIS — Z9884 Bariatric surgery status: Secondary | ICD-10-CM | POA: Diagnosis not present

## 2022-02-04 DIAGNOSIS — D500 Iron deficiency anemia secondary to blood loss (chronic): Secondary | ICD-10-CM | POA: Diagnosis not present

## 2022-02-04 DIAGNOSIS — K909 Intestinal malabsorption, unspecified: Secondary | ICD-10-CM | POA: Insufficient documentation

## 2022-02-04 DIAGNOSIS — D509 Iron deficiency anemia, unspecified: Secondary | ICD-10-CM | POA: Diagnosis present

## 2022-02-04 DIAGNOSIS — D508 Other iron deficiency anemias: Secondary | ICD-10-CM

## 2022-02-04 LAB — CBC WITH DIFFERENTIAL (CANCER CENTER ONLY)
Abs Immature Granulocytes: 0.05 10*3/uL (ref 0.00–0.07)
Basophils Absolute: 0.1 10*3/uL (ref 0.0–0.1)
Basophils Relative: 1 %
Eosinophils Absolute: 0.1 10*3/uL (ref 0.0–0.5)
Eosinophils Relative: 1 %
HCT: 37.7 % (ref 36.0–46.0)
Hemoglobin: 12.1 g/dL (ref 12.0–15.0)
Immature Granulocytes: 1 %
Lymphocytes Relative: 36 %
Lymphs Abs: 2.3 10*3/uL (ref 0.7–4.0)
MCH: 26.9 pg (ref 26.0–34.0)
MCHC: 32.1 g/dL (ref 30.0–36.0)
MCV: 84 fL (ref 80.0–100.0)
Monocytes Absolute: 0.7 10*3/uL (ref 0.1–1.0)
Monocytes Relative: 12 %
Neutro Abs: 3.1 10*3/uL (ref 1.7–7.7)
Neutrophils Relative %: 49 %
Platelet Count: 282 10*3/uL (ref 150–400)
RBC: 4.49 MIL/uL (ref 3.87–5.11)
RDW: 15.6 % — ABNORMAL HIGH (ref 11.5–15.5)
WBC Count: 6.2 10*3/uL (ref 4.0–10.5)
nRBC: 0 % (ref 0.0–0.2)

## 2022-02-04 LAB — RETICULOCYTES
Immature Retic Fract: 19.2 % — ABNORMAL HIGH (ref 2.3–15.9)
RBC.: 4.48 MIL/uL (ref 3.87–5.11)
Retic Count, Absolute: 82.9 10*3/uL (ref 19.0–186.0)
Retic Ct Pct: 1.9 % (ref 0.4–3.1)

## 2022-02-04 LAB — FERRITIN: Ferritin: 9 ng/mL — ABNORMAL LOW (ref 11–307)
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
## 2022-02-05 LAB — IRON AND IRON BINDING CAPACITY (CC-WL,HP ONLY)
Iron: 132 ug/dL (ref 28–170)
Saturation Ratios: 24 % (ref 10.4–31.8)
TIBC: 543 ug/dL — ABNORMAL HIGH (ref 250–450)
UIBC: 411 ug/dL (ref 148–442)

## 2022-02-12 ENCOUNTER — Inpatient Hospital Stay: Payer: Medicaid Other | Attending: Hematology & Oncology

## 2022-02-12 VITALS — BP 146/79 | HR 99 | Temp 98.7°F | Resp 18

## 2022-02-12 DIAGNOSIS — D509 Iron deficiency anemia, unspecified: Secondary | ICD-10-CM | POA: Diagnosis present

## 2022-02-12 DIAGNOSIS — N946 Dysmenorrhea, unspecified: Secondary | ICD-10-CM

## 2022-02-12 DIAGNOSIS — K909 Intestinal malabsorption, unspecified: Secondary | ICD-10-CM | POA: Insufficient documentation

## 2022-02-12 DIAGNOSIS — D500 Iron deficiency anemia secondary to blood loss (chronic): Secondary | ICD-10-CM

## 2022-02-12 MED ORDER — SODIUM CHLORIDE 0.9 % IV SOLN
125.0000 mg | Freq: Once | INTRAVENOUS | Status: AC
  Administered 2022-02-12: 125 mg via INTRAVENOUS
  Filled 2022-02-12: qty 125

## 2022-02-12 MED ORDER — SODIUM CHLORIDE 0.9 % IV SOLN: Freq: Once | INTRAVENOUS | Status: AC

## 2022-02-12 NOTE — Patient Instructions (Signed)
Sodium Ferric Gluconate Complex Injection What is this medication? SODIUM FERRIC GLUCONATE COMPLEX (SOE dee um FER ik GLOO koe nate KOM pleks) treats low levels of iron (iron deficiency anemia) in people with kidney disease. Iron is a mineral that plays an important role in making red blood cells, which carry oxygen from your lungs to the rest of your body. This medicine may be used for other purposes; ask your health care provider or pharmacist if you have questions. COMMON BRAND NAME(S): Ferrlecit, Nulecit What should I tell my care team before I take this medication? They need to know if you have any of the following conditions: Anemia that is not from iron deficiency High levels of iron in the blood An unusual or allergic reaction to iron, other medications, foods, dyes, or preservatives Pregnant or are trying to become pregnant Breast-feeding How should I use this medication? This medication is injected into a vein. It is given by your care team in a hospital or clinic setting. Talk to your care team about the use of this medication in children. While it may be prescribed for children as young as 6 years for selected conditions, precautions do apply. Overdosage: If you think you have taken too much of this medicine contact a poison control center or emergency room at once. NOTE: This medicine is only for you. Do not share this medicine with others. What if I miss a dose? It is important not to miss your dose. Call your care team if you are unable to keep an appointment. What may interact with this medication? Do not take this medication with any of the following: Deferasirox Deferoxamine Dimercaprol This medication may also interact with the following: Other iron products This list may not describe all possible interactions. Give your health care provider a list of all the medicines, herbs, non-prescription drugs, or dietary supplements you use. Also tell them if you smoke, drink  alcohol, or use illegal drugs. Some items may interact with your medicine. What should I watch for while using this medication? Your condition will be monitored carefully while you are receiving this medication. Visit your care team for regular checks on your progress. You may need blood work while you are taking this medication. What side effects may I notice from receiving this medication? Side effects that you should report to your care team as soon as possible: Allergic reactions--skin rash, itching, hives, swelling of the face, lips, tongue, or throat Low blood pressure--dizziness, feeling faint or lightheaded, blurry vision Shortness of breath Side effects that usually do not require medical attention (report to your care team if they continue or are bothersome): Flushing Headache Joint pain Muscle pain Nausea Pain, redness, or irritation at injection site This list may not describe all possible side effects. Call your doctor for medical advice about side effects. You may report side effects to FDA at 1-800-FDA-1088. Where should I keep my medication? This medication is given in a hospital or clinic and will not be stored at home. NOTE: This sheet is a summary. It may not cover all possible information. If you have questions about this medicine, talk to your doctor, pharmacist, or health care provider.  2023 Elsevier/Gold Standard (2020-12-20 00:00:00)  

## 2022-02-19 ENCOUNTER — Inpatient Hospital Stay: Payer: Medicaid Other

## 2022-02-19 VITALS — BP 127/83 | HR 74 | Temp 98.3°F | Resp 17

## 2022-02-19 DIAGNOSIS — D500 Iron deficiency anemia secondary to blood loss (chronic): Secondary | ICD-10-CM

## 2022-02-19 DIAGNOSIS — N946 Dysmenorrhea, unspecified: Secondary | ICD-10-CM

## 2022-02-19 DIAGNOSIS — D509 Iron deficiency anemia, unspecified: Secondary | ICD-10-CM | POA: Diagnosis not present

## 2022-02-19 MED ORDER — SODIUM CHLORIDE 0.9 % IV SOLN: Freq: Once | INTRAVENOUS | Status: AC

## 2022-02-19 MED ORDER — SODIUM CHLORIDE 0.9 % IV SOLN
125.0000 mg | Freq: Once | INTRAVENOUS | Status: AC
  Administered 2022-02-19: 125 mg via INTRAVENOUS
  Filled 2022-02-19: qty 125

## 2022-02-19 NOTE — Patient Instructions (Signed)
Sodium Ferric Gluconate Complex Injection What is this medication? SODIUM FERRIC GLUCONATE COMPLEX (SOE dee um FER ik GLOO koe nate KOM pleks) treats low levels of iron (iron deficiency anemia) in people with kidney disease. Iron is a mineral that plays an important role in making red blood cells, which carry oxygen from your lungs to the rest of your body. This medicine may be used for other purposes; ask your health care provider or pharmacist if you have questions. COMMON BRAND NAME(S): Ferrlecit, Nulecit What should I tell my care team before I take this medication? They need to know if you have any of the following conditions: Anemia that is not from iron deficiency High levels of iron in the blood An unusual or allergic reaction to iron, other medications, foods, dyes, or preservatives Pregnant or are trying to become pregnant Breast-feeding How should I use this medication? This medication is injected into a vein. It is given by your care team in a hospital or clinic setting. Talk to your care team about the use of this medication in children. While it may be prescribed for children as young as 6 years for selected conditions, precautions do apply. Overdosage: If you think you have taken too much of this medicine contact a poison control center or emergency room at once. NOTE: This medicine is only for you. Do not share this medicine with others. What if I miss a dose? It is important not to miss your dose. Call your care team if you are unable to keep an appointment. What may interact with this medication? Do not take this medication with any of the following: Deferasirox Deferoxamine Dimercaprol This medication may also interact with the following: Other iron products This list may not describe all possible interactions. Give your health care provider a list of all the medicines, herbs, non-prescription drugs, or dietary supplements you use. Also tell them if you smoke, drink  alcohol, or use illegal drugs. Some items may interact with your medicine. What should I watch for while using this medication? Your condition will be monitored carefully while you are receiving this medication. Visit your care team for regular checks on your progress. You may need blood work while you are taking this medication. What side effects may I notice from receiving this medication? Side effects that you should report to your care team as soon as possible: Allergic reactions--skin rash, itching, hives, swelling of the face, lips, tongue, or throat Low blood pressure--dizziness, feeling faint or lightheaded, blurry vision Shortness of breath Side effects that usually do not require medical attention (report to your care team if they continue or are bothersome): Flushing Headache Joint pain Muscle pain Nausea Pain, redness, or irritation at injection site This list may not describe all possible side effects. Call your doctor for medical advice about side effects. You may report side effects to FDA at 1-800-FDA-1088. Where should I keep my medication? This medication is given in a hospital or clinic and will not be stored at home. NOTE: This sheet is a summary. It may not cover all possible information. If you have questions about this medicine, talk to your doctor, pharmacist, or health care provider.  2023 [employer] (2020-12-20 00:00:00)
